# Patient Record
Sex: Female | Born: 1965 | Race: White | Hispanic: No | Smoking: Former smoker
Health system: Southern US, Community
[De-identification: ages and names within clinical notes are randomized; demographics above are authoritative.]

## PROBLEM LIST (undated history)

## (undated) DIAGNOSIS — R519 Headache, unspecified: Secondary | ICD-10-CM

## (undated) DIAGNOSIS — Z8619 Personal history of other infectious and parasitic diseases: Secondary | ICD-10-CM

## (undated) DIAGNOSIS — F329 Major depressive disorder, single episode, unspecified: Secondary | ICD-10-CM

## (undated) DIAGNOSIS — F32A Depression, unspecified: Secondary | ICD-10-CM

## (undated) DIAGNOSIS — E559 Vitamin D deficiency, unspecified: Secondary | ICD-10-CM

## (undated) DIAGNOSIS — R51 Headache: Secondary | ICD-10-CM

## (undated) HISTORY — DX: Major depressive disorder, single episode, unspecified: F32.9

## (undated) HISTORY — DX: Headache: R51

## (undated) HISTORY — DX: Vitamin D deficiency, unspecified: E55.9

## (undated) HISTORY — DX: Depression, unspecified: F32.A

## (undated) HISTORY — DX: Headache, unspecified: R51.9

## (undated) HISTORY — DX: Personal history of other infectious and parasitic diseases: Z86.19

---

## 2017-09-14 ENCOUNTER — Encounter: Payer: Self-pay | Admitting: Nurse Practitioner

## 2017-09-14 ENCOUNTER — Other Ambulatory Visit (INDEPENDENT_AMBULATORY_CARE_PROVIDER_SITE_OTHER): Payer: Commercial Managed Care - PPO

## 2017-09-14 ENCOUNTER — Ambulatory Visit (INDEPENDENT_AMBULATORY_CARE_PROVIDER_SITE_OTHER): Payer: Commercial Managed Care - PPO | Admitting: Nurse Practitioner

## 2017-09-14 VITALS — BP 112/70 | HR 70 | Temp 98.2°F | Resp 16 | Ht 66.0 in | Wt 187.0 lb

## 2017-09-14 DIAGNOSIS — Z23 Encounter for immunization: Secondary | ICD-10-CM

## 2017-09-14 DIAGNOSIS — L908 Other atrophic disorders of skin: Secondary | ICD-10-CM | POA: Diagnosis not present

## 2017-09-14 DIAGNOSIS — Z0001 Encounter for general adult medical examination with abnormal findings: Secondary | ICD-10-CM | POA: Diagnosis not present

## 2017-09-14 DIAGNOSIS — N6009 Solitary cyst of unspecified breast: Secondary | ICD-10-CM

## 2017-09-14 DIAGNOSIS — Z0000 Encounter for general adult medical examination without abnormal findings: Secondary | ICD-10-CM | POA: Insufficient documentation

## 2017-09-14 DIAGNOSIS — Z13220 Encounter for screening for lipoid disorders: Secondary | ICD-10-CM

## 2017-09-14 DIAGNOSIS — E669 Obesity, unspecified: Secondary | ICD-10-CM

## 2017-09-14 LAB — COMPREHENSIVE METABOLIC PANEL
ALT: 14 U/L (ref 0–35)
AST: 15 U/L (ref 0–37)
Albumin: 3.8 g/dL (ref 3.5–5.2)
Alkaline Phosphatase: 41 U/L (ref 39–117)
BUN: 15 mg/dL (ref 6–23)
CALCIUM: 8.8 mg/dL (ref 8.4–10.5)
CHLORIDE: 106 meq/L (ref 96–112)
CO2: 26 meq/L (ref 19–32)
CREATININE: 0.73 mg/dL (ref 0.40–1.20)
GFR: 89.87 mL/min (ref >60.00)
Glucose, Bld: 90 mg/dL (ref 70–99)
POTASSIUM: 3.9 meq/L (ref 3.5–5.1)
SODIUM: 137 meq/L (ref 135–145)
Total Bilirubin: 0.5 mg/dL (ref 0.2–1.2)
Total Protein: 6.8 g/dL (ref 6.0–8.3)

## 2017-09-14 LAB — CBC WITH DIFFERENTIAL/PLATELET
BASOS PCT: 0.6 % (ref 0.0–3.0)
Basophils Absolute: 0.1 10*3/uL (ref 0.0–0.1)
EOS ABS: 0.2 10*3/uL (ref 0.0–0.7)
Eosinophils Relative: 2.4 % (ref 0.0–5.0)
HCT: 39.4 % (ref 36.0–46.0)
Hemoglobin: 12.8 g/dL (ref 12.0–15.0)
LYMPHS ABS: 2.4 10*3/uL (ref 0.7–4.0)
Lymphocytes Relative: 26.9 % (ref 12.0–46.0)
MCHC: 32.4 g/dL (ref 30.0–36.0)
MCV: 84.9 fl (ref 78.0–100.0)
MONO ABS: 0.7 10*3/uL (ref 0.1–1.0)
Monocytes Relative: 7.9 % (ref 3.0–12.0)
NEUTROS PCT: 62.2 % (ref 43.0–77.0)
Neutro Abs: 5.6 10*3/uL (ref 1.4–7.7)
PLATELETS: 209 10*3/uL (ref 150.0–400.0)
RBC: 4.65 Mil/uL (ref 3.87–5.11)
RDW: 14.1 % (ref 11.5–15.5)
WBC: 8.9 10*3/uL (ref 4.0–10.5)

## 2017-09-14 LAB — LIPID PANEL
CHOL/HDL RATIO: 4
Cholesterol: 199 mg/dL (ref 0–200)
HDL: 52.7 mg/dL (ref >39.00)
LDL Cholesterol: 121 mg/dL — ABNORMAL HIGH (ref 0–99)
NONHDL: 146.24
Triglycerides: 126 mg/dL (ref 0.0–149.0)
VLDL: 25.2 mg/dL (ref 0.0–40.0)

## 2017-09-14 LAB — HEMOGLOBIN A1C: Hgb A1c MFr Bld: 5.4 % (ref 4.6–6.5)

## 2017-09-14 LAB — TSH: TSH: 3.3 u[IU]/mL (ref 0.35–4.50)

## 2017-09-14 NOTE — Assessment & Plan Note (Signed)
-  USPSTF grade A and B recommendations reviewed with patient; age-appropriate recommendations, preventive care, screening tests, etc discussed and encouraged; healthy living encouraged; see AVS for patient education given to patient -Discussed importance of 150 minutes of physical activity weekly, eat 6 servings of fruit/vegetables daily and drink plenty of water and avoid sweet beverages.  -Reviewed Health Maintenance: TDAP given today, otherwise up to date screening for cholesterol level - Lipid panel; Future Skin aging Annual skin check - Ambulatory referral to Dermatology

## 2017-09-14 NOTE — Progress Notes (Deleted)
Name: Kimberly Ayala   MRN: [id number]    DOB: May 14, 1966   Date:09/14/2017       Progress Note  Subjective  Chief Complaint  Chief Complaint  Patient presents with  . Establish Care    CPE, order for mammogram, fasting    HPI  Patient presents for annual CPE ***. Just moved from [place]  USPSTF grade A and B recommendations:  Diet: Breakfast: light-english muffin and cheese, Lunch-fish; Dinner-cooks at home- meat, fish, vegetables; Snacks-fruit or vegetables; Drinks-water, coffee in the am, one soda a day Exercise: not routinely exercising  Depression:  No flowsheet data found. Your depression screening is negative today-do you feel like you have had any issues? No concerns-  Hypertension: BP Readings from Last 3 Encounters:  09/14/17 112/70   Your BP is stable/not at goal- can you please return for discussion and management of this in the next 2 weeks so we can focus on that?  Obesity: Wt Readings from Last 3 Encounters:  09/14/17 187 lb (84.8 kg)   BMI Readings from Last 3 Encounters:  09/14/17 30.18 kg/m    Your BMI is X-its not always about that number but we do need to check for diabetes and cholesterol-code A1c and lipids  Lipids:  No results found for: CHOL No results found for: HDL No results found for: LDLCALC No results found for: TRIG No results found for: CHOLHDL No results found for: LDLDIRECT Glucose:  No results found for: GLUCOSE, GLUCAP q year as a screening or routine with HLD but they need f/u visit to discuss results  Alcohol: ***How many drinks a week? Social on weekend Tobacco use: ***how many PPD? Smoking cessation if needed. "Smoking cessation discussed." Former - quit 21 years ago  Divorced are you monogamous?  STD testing and prevention (chl/gon/syphilis): declines HIV, hep C: ***We will test today/declines-once in lifetime or annually if not monogamous? Once for hep c with baby boomers- it is recommended that we check. Declines,     Skin cancer: ***Concerning lesions? Dermatology? Referral to derm Colorectal cancer: ***Personal or family history? Bloody, dark,tarry stools? Changes in bowel movement? At age 40 order colonoscopy. If 40 or over, refer to GI. If under 40-hemocult if blood-refer to GI if +; miralax if constipated. NO Prostate cancer: *** No results found for: PSA IPSS Questionnaire (AUA-7): Over the past month.   1)  How often have you had a sensation of not emptying your bladder completely after you finish urinating?  {Rating:19227}  2)  How often have you had to urinate again less than two hours after you finished urinating? {Rating:19227}  3)  How often have you found you stopped and started again several times when you urinated?  {Rating:19227}  4) How difficult have you found it to postpone urination?  {Rating:19227}  5) How often have you had a weak urinary stream?  {Rating:19227}  6) How often have you had to push or strain to begin urination?  {Rating:19227}  7) How many times did you most typically get up to urinate from the time you went to bed until the time you got up in the morning?  {Rating:19228}  Total score:  0-7 mildly symptomatic-can offer PSA if they want or monitor-always if family hx   8-19 moderately symptomatic-always PSA   20-35 severely symptomatic-always PSA  Associate PSA with screening for prostate cancer  Lung cancer:  *** Low Dose CT Chest (ORDER CT chest lung ca screen low dose w/o CM-associate with lung cancer screening)  recommended if Age 64-80 years, 30 pack-year  currently smoking OR have quit w/in 15years. Patient {DOES NOT does:27190::"does not"} qualify.   AAA (ORDER US abd aorta screening AAA-associate with screening for AAA): *** The USPSTF recommends one-time screening with ultrasonography in men ages 7165 to 3175 years who have ever smoked-to check for weakening in your main artery so that we know and can monitor it- refer to specialist if abnormal (Have patient call  insurance to check coverage while awaiting referral)  Aspirin: ***taking? If HLD/CAD and no CI- recommend 81 daily-document "aortic atherosclerosis and recommended 81mg  asa today" no  ECG:  ***if indicated-if they report CP or SOB in ROS  {VACCINES HISTORY:21023000} testanus today   {hx healthmaintenance rooming patient:311061}  Advanced Care Planning: A voluntary discussion about advance care planning including the explanation and discussion of advance directives.  Discussed health care proxy and Living will, and the patient was able to identify a health care proxy as ***.  Patient {DOES_DOES XLK:44010}OT:18564} have a living will at present time. If patient does have living will, I have requested they bring this to the clinic to be scanned in to their chart. DOES NOT   There are no active problems to display for this patient.   *** The histories are not reviewed yet. Please review them in the "History" navigator section and refresh this SmartLink.  Family History  Problem Relation Age of Onset  . Diabetes Mother   . Hypertension Mother   . Hypertension Brother     Social History   Socioeconomic History  . Marital status: Divorced    Spouse name: Not on file  . Number of children: Not on file  . Years of education: Not on file  . Highest education level: Not on file  Social Needs  . Financial resource strain: Not on file  . Food insecurity - worry: Not on file  . Food insecurity - inability: Not on file  . Transportation needs - medical: Not on file  . Transportation needs - non-medical: Not on file  Occupational History  . Not on file  Tobacco Use  . Smoking status: Former Games developermoker  . Smokeless tobacco: Never Used  Substance and Sexual Activity  . Alcohol use: Yes  . Drug use: No  . Sexual activity: Not on file  Other Topics Concern  . Not on file  Social History Narrative  . Not on file    No current outpatient medications on file.  No Known  Allergies   ROS  *** Constitutional: Negative for fever or weight change.  Respiratory: Negative for cough and shortness of breath.   Cardiovascular: Negative for chest pain or palpitations.  Gastrointestinal: Negative for abdominal pain, no bowel changes.  Musculoskeletal: Negative for gait problem or joint swelling.  Skin: Negative for rash.  Neurological: Negative for dizziness or headache.  No other specific complaints in a complete review of systems (except as listed in HPI above).   Objective  Vitals:   09/14/17 1005  BP: 112/70  Pulse: 70  Resp: 16  Temp: 98.2 F (36.8 C)  TempSrc: Oral  SpO2: 98%  Weight: 187 lb (84.8 kg)  Height: 5\' 6"  (1.676 m)    Body mass index is 30.18 kg/m.  Physical Exam *** Constitutional: Patient appears well-developed and well-nourished. No distress.  HENT: Head: Normocephalic and atraumatic. Ears: B TMs ok, no erythema or effusion; Nose: Nose normal. Mouth/Throat: Oropharynx is clear and moist. No oropharyngeal exudate.  Eyes: Conjunctivae and EOM are  normal. Pupils are equal, round, and reactive to light. No scleral icterus.  Neck: Normal range of motion. Neck supple. No JVD present. No thyromegaly present.  Cardiovascular: Normal rate, regular rhythm and normal heart sounds.  No murmur heard. No BLE edema. Pulmonary/Chest: Effort normal and breath sounds normal. No respiratory distress. Abdominal: Soft. Bowel sounds are normal, no distension. There is no tenderness. no masses FEMALE GENITALIA: Normal descended testes bilaterally, no masses palpated, no hernias, no lesions, no discharge RECTAL: Prostate normal size and consistency, no rectal masses or hemorrhoids Musculoskeletal: Normal range of motion, no joint effusions. No gross deformities Neurological: he is alert and oriented to person, place, and time. No cranial nerve deficit. Coordination, balance, strength, speech and gait are normal.  Skin: Skin is warm and dry. No rash  noted. No erythema.  Psychiatric: Patient has a normal mood and affect. behavior is normal. Judgment and thought content normal.  No results found for this or any previous visit (from the past 2160 hour(s)).  Diabetic Foot Exam: Diabetic Foot Exam - Simple   No data filed     ***  PHQ2/9: No flowsheet data found. ***  Fall Risk: No flowsheet data found. ***   Assessment & Plan  There are no diagnoses linked to this encounter.   -Prostate cancer screening and PSA options (with potential risks and benefits of testing vs not testing) were discussed along with recent recs/guidelines. -USPSTF grade A and B recommendations reviewed with patient; age-appropriate recommendations, preventive care, screening tests, etc discussed and encouraged; healthy living encouraged; see AVS for patient education given to patient -Discussed importance of 150 minutes of physical activity weekly, eat two servings of fish weekly, eat one serving of tree nuts ( cashews, pistachios, pecans, almonds.Marland Kitchen) every other day, eat 6 servings of fruit/vegetables daily and drink plenty of water and avoid sweet beverages.  -Red flags and when to present for emergency care or RTC including fever >101.77F, chest pain, shortness of breath, new/worsening/un-resolving symptoms, *** reviewed with patient at time of visit. Follow up and care instructions discussed and provided in AVS. DELETE IF NOT NEEDED -Reviewed Health Maintenance: ***what did we do in health maintenance  ***Return in 1-2 weeks for chronic problems to discuss labs and management- cholesterol, diabetes, thyroid, vitamin d , HTN  Referral to derm tdap today Lipids, a1c, tsh cbc, cmet Mammogram - every 6 months
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
## 2017-09-14 NOTE — Patient Instructions (Signed)
Please head downstairs for lab work.  Please work on your diet and exercise as we discussed. Remember half of your plate should be veggies, one-fourth carbs, one-fourth meat, and don't eat meat at every meal. Also, remember to stay away from sugary drinks. I'd like for you to start incorporating exercise into your daily schedule. Start at 10 minutes a day, working up to 30 minutes five times a week.   Ill see you back in 1 year, or sooner if you need me.  Thanks for letting me take care of you today. Welcome to [facility]!   Preventive Care 40-64 Years, Female Preventive care refers to lifestyle choices and visits with your health care provider that can promote health and wellness. What does preventive care include?  A yearly physical exam. This is also called an annual well check.  Dental exams once or twice a year.  Routine eye exams. Ask your health care provider how often you should have your eyes checked.  Personal lifestyle choices, including: ? Daily care of your teeth and gums. ? Regular physical activity. ? Eating a healthy diet. ? Avoiding tobacco and drug use. ? Limiting alcohol use. ? Practicing safe sex. ? Taking low-dose aspirin daily starting at age 78. ? Taking vitamin and mineral supplements as recommended by your health care provider. What happens during an annual well check? The services and screenings done by your health care provider during your annual well check will depend on your age, overall health, lifestyle risk factors, and family history of disease. Counseling Your health care provider may ask you questions about your:  Alcohol use.  Tobacco use.  Drug use.  Emotional well-being.  Home and relationship well-being.  Sexual activity.  Eating habits.  Work and work Statistician.  Method of birth control.  Menstrual cycle.  Pregnancy history.  Screening You may have the following tests or measurements:  Height, weight, and BMI.  Blood  pressure.  Lipid and cholesterol levels. These may be checked every 5 years, or more frequently if you are over 62 years old.  Skin check.  Lung cancer screening. You may have this screening every year starting at age 51 if you have a 30-pack-year history of smoking and currently smoke or have quit within the past 15 years.  Fecal occult blood test (FOBT) of the stool. You may have this test every year starting at age 48.  Flexible sigmoidoscopy or colonoscopy. You may have a sigmoidoscopy every 5 years or a colonoscopy every 10 years starting at age 72.  Hepatitis C blood test.  Hepatitis B blood test.  Sexually transmitted disease (STD) testing.  Diabetes screening. This is done by checking your blood sugar (glucose) after you have not eaten for a while (fasting). You may have this done every 1-3 years.  Mammogram. This may be done every 1-2 years. Talk to your health care provider about when you should start having regular mammograms. This may depend on whether you have a family history of breast cancer.  BRCA-related cancer screening. This may be done if you have a family history of breast, ovarian, tubal, or peritoneal cancers.  Pelvic exam and Pap test. This may be done every 3 years starting at age 1. Starting at age 20, this may be done every 5 years if you have a Pap test in combination with an HPV test.  Bone density scan. This is done to screen for osteoporosis. You may have this scan if you are at high risk for osteoporosis.  Discuss your test results, treatment options, and if necessary, the need for more tests with your health care provider. Vaccines Your health care provider may recommend certain vaccines, such as:  Influenza vaccine. This is recommended every year.  Tetanus, diphtheria, and acellular pertussis (Tdap, Td) vaccine. You may need a Td booster every 10 years.  Varicella vaccine. You may need this if you have not been vaccinated.  Zoster vaccine. You  may need this after age 5.  Measles, mumps, and rubella (MMR) vaccine. You may need at least one dose of MMR if you were born in 1957 or later. You may also need a second dose.  Pneumococcal 13-valent conjugate (PCV13) vaccine. You may need this if you have certain conditions and were not previously vaccinated.  Pneumococcal polysaccharide (PPSV23) vaccine. You may need one or two doses if you smoke cigarettes or if you have certain conditions.  Meningococcal vaccine. You may need this if you have certain conditions.  Hepatitis A vaccine. You may need this if you have certain conditions or if you travel or work in places where you may be exposed to hepatitis A.  Hepatitis B vaccine. You may need this if you have certain conditions or if you travel or work in places where you may be exposed to hepatitis B.  Haemophilus influenzae type b (Hib) vaccine. You may need this if you have certain conditions.  Talk to your health care provider about which screenings and vaccines you need and how often you need them. This information is not intended to replace advice given to you by your health care provider. Make sure you discuss any questions you have with your health care provider. Document Released: 08/22/2015 Document Revised: 04/14/2016 Document Reviewed: 05/27/2015 Elsevier Interactive Patient Education  [employer].

## 2017-09-14 NOTE — Assessment & Plan Note (Signed)
We discussed healthy diet and exercise in the maintenance of overall health - See AVS for education provided to patient. - CBC with Differential/Platelet; Future - Comprehensive metabolic panel; Future - TSH; Future - Lipid panel; Future - Hemoglobin A1c; Future

## 2017-09-14 NOTE — Assessment & Plan Note (Addendum)
She is not sure which breast. She is going to call her provider in [place] and provide update so I can order diagnostic imaging-[place]. She did complete release form for old records in our office today

## 2017-09-14 NOTE — Addendum Note (Signed)
Addended by: Mercer PodWRENN, Apryll Hinkle E on: 09/14/2017 11:39 AM   Modules accepted: Orders

## 2017-09-15 ENCOUNTER — Other Ambulatory Visit: Payer: Self-pay | Admitting: Nurse Practitioner

## 2017-09-15 DIAGNOSIS — R928 Other abnormal and inconclusive findings on diagnostic imaging of breast: Secondary | ICD-10-CM

## 2017-10-26 ENCOUNTER — Ambulatory Visit
Admission: RE | Admit: 2017-10-26 | Discharge: 2017-10-26 | Disposition: A | Discharge status: Home / Self Care | Payer: Commercial Managed Care - PPO | Source: Ambulatory Visit | Attending: Nurse Practitioner | Admitting: Nurse Practitioner

## 2017-10-26 DIAGNOSIS — R928 Other abnormal and inconclusive findings on diagnostic imaging of breast: Secondary | ICD-10-CM

## 2020-07-11 ENCOUNTER — Encounter: Payer: Self-pay | Admitting: Nurse Practitioner

## 2020-07-11 ENCOUNTER — Other Ambulatory Visit: Payer: Self-pay

## 2020-07-11 ENCOUNTER — Ambulatory Visit (INDEPENDENT_AMBULATORY_CARE_PROVIDER_SITE_OTHER): Payer: Medicaid Other | Admitting: Nurse Practitioner

## 2020-07-11 VITALS — BP 127/79 | HR 70 | Temp 98.6°F | Ht 66.0 in | Wt 197.5 lb

## 2020-07-11 DIAGNOSIS — Z0000 Encounter for general adult medical examination without abnormal findings: Secondary | ICD-10-CM | POA: Diagnosis not present

## 2020-07-11 DIAGNOSIS — Z0001 Encounter for general adult medical examination with abnormal findings: Secondary | ICD-10-CM

## 2020-07-11 DIAGNOSIS — E669 Obesity, unspecified: Secondary | ICD-10-CM

## 2020-07-11 NOTE — Patient Instructions (Signed)
Health Maintenance, Female Adopting a healthy lifestyle and getting preventive care are important in promoting health and wellness. Ask your health care provider about:  The right schedule for you to have regular tests and exams.  Things you can do on your own to prevent diseases and keep yourself healthy. What should I know about diet, weight, and exercise? Eat a healthy diet   Eat a diet that includes plenty of vegetables, fruits, low-fat dairy products, and lean protein.  Do not eat a lot of foods that are high in solid fats, added sugars, or sodium. Maintain a healthy weight Body mass index (BMI) is used to identify weight problems. It estimates body fat based on height and weight. Your health care provider can help determine your BMI and help you achieve or maintain a healthy weight. Get regular exercise Get regular exercise. This is one of the most important things you can do for your health. Most adults should:  Exercise for at least 150 minutes each week. The exercise should increase your heart rate and make you sweat (moderate-intensity exercise).  Do strengthening exercises at least twice a week. This is in addition to the moderate-intensity exercise.  Spend less time sitting. Even light physical activity can be beneficial. Watch cholesterol and blood lipids Have your blood tested for lipids and cholesterol at 52 years of age, then have this test every 5 years. Have your cholesterol levels checked more often if:  Your lipid or cholesterol levels are high.  You are older than 52 years of age.  You are at high risk for heart disease. What should I know about cancer screening? Depending on your health history and family history, you may need to have cancer screening at various ages. This may include screening for:  Breast cancer.  Cervical cancer.  Colorectal cancer.  Skin cancer.  Lung cancer. What should I know about heart disease, diabetes, and high blood  pressure? Blood pressure and heart disease  High blood pressure causes heart disease and increases the risk of stroke. This is more likely to develop in people who have high blood pressure readings, are of African descent, or are overweight.  Have your blood pressure checked: ? Every 3-5 years if you are 18-39 years of age. ? Every year if you are 40 years old or older. Diabetes Have regular diabetes screenings. This checks your fasting blood sugar level. Have the screening done:  Once every three years after age 40 if you are at a normal weight and have a low risk for diabetes.  More often and at a younger age if you are overweight or have a high risk for diabetes. What should I know about preventing infection? Hepatitis B If you have a higher risk for hepatitis B, you should be screened for this virus. Talk with your health care provider to find out if you are at risk for hepatitis B infection. Hepatitis C Testing is recommended for:  Everyone born from 1945 through 1965.  Anyone with known risk factors for hepatitis C. Sexually transmitted infections (STIs)  Get screened for STIs, including gonorrhea and chlamydia, if: ? You are sexually active and are younger than 52 years of age. ? You are older than 52 years of age and your health care provider tells you that you are at risk for this type of infection. ? Your sexual activity has changed since you were last screened, and you are at increased risk for chlamydia or gonorrhea. Ask your health care provider if   you are at risk.  Ask your health care provider about whether you are at high risk for HIV. Your health care provider may recommend a prescription medicine to help prevent HIV infection. If you choose to take medicine to prevent HIV, you should first get tested for HIV. You should then be tested every 3 months for as long as you are taking the medicine. Pregnancy  If you are about to stop having your period (premenopausal) and  you may become pregnant, seek counseling before you get pregnant.  Take 400 to 800 micrograms (mcg) of folic acid every day if you become pregnant.  Ask for birth control (contraception) if you want to prevent pregnancy. Osteoporosis and menopause Osteoporosis is a disease in which the bones lose minerals and strength with aging. This can result in bone fractures. If you are 65 years old or older, or if you are at risk for osteoporosis and fractures, ask your health care provider if you should:  Be screened for bone loss.  Take a calcium or vitamin D supplement to lower your risk of fractures.  Be given hormone replacement therapy (HRT) to treat symptoms of menopause. Follow these instructions at home: Lifestyle  Do not use any products that contain nicotine or tobacco, such as cigarettes, e-cigarettes, and chewing tobacco. If you need help quitting, ask your health care provider.  Do not use street drugs.  Do not share needles.  Ask your health care provider for help if you need support or information about quitting drugs. Alcohol use  Do not drink alcohol if: ? Your health care provider tells you not to drink. ? You are pregnant, may be pregnant, or are planning to become pregnant.  If you drink alcohol: ? Limit how much you use to 0-1 drink a day. ? Limit intake if you are breastfeeding.  Be aware of how much alcohol is in your drink. In the [place] one drink equals one 12 oz bottle of beer (355 mL), one 5 oz glass of wine (148 mL), or one 1 oz glass of hard liquor (44 mL). General instructions  Schedule regular health, dental, and eye exams.  Stay current with your vaccines.  Tell your health care provider if: ? You often feel depressed. ? You have ever been abused or do not feel safe at home. Summary  Adopting a healthy lifestyle and getting preventive care are important in promoting health and wellness.  Follow your health care provider's instructions about healthy  diet, exercising, and getting tested or screened for diseases.  Follow your health care provider's instructions on monitoring your cholesterol and blood pressure. This information is not intended to replace advice given to you by your health care provider. Make sure you discuss any questions you have with your health care provider. Document Revised: 07/19/2018 Document Reviewed: 07/19/2018 Elsevier Patient Education  2020 Elsevier Inc[employer]  

## 2020-07-11 NOTE — Progress Notes (Signed)
New Patient Note  RE: Kimberly Ayala MRN: [id number] DOB: [birth year] Date of Office Visit: 07/11/2020  Chief Complaint: New Patient (Initial Visit)  Annual physical Exam  History of Present Illness: .   Encounter for general adult medical examination without abnormal findings  Physical: Patient's last physical exam was a few ears ago .  Weight: Appropriate for height (BMI less than 27%) ; No Blood Pressure: Normal (BP less than 120/80) ;  Medical History: Patient history reviewed ; Family history reviewed ;  Allergies Reviewed: No change in current allergies ;  Medications Reviewed: Medications reviewed - no changes ;  Lipids: Normal lipid levels ;  Smoking: Life-long non-smoker ;  Physical Activity: Exercises at least 3 times per week ; No Alcohol/Drug Use: Is a social -drinker ; No illicit drug use ;  Patient is not afflicted from Stress Incontinence and Urge Incontinence  Safety: reviewed ; Patient wears a seat belt, has smoke detectors, has carbon monoxide detectors, practices appropriate gun safety, and does not wear sunscreen with extended sun exposure. Dental Care: biannual cleanings, brushes and flosses daily. Ophthalmology/Optometry: Annual visit.  Hearing loss: none Vision impairments: none   Assessment and Plan: Kimberly Ayala is a 54 y.o. female with: Annual physical exam Patient is a 54 year old female who presents to clinic for an annual physical exam without abnormal findings.  Completed head to toe assessment.  Patient is a healthy woman with no prior medical history.  Provided education on preventative and health care maintenance.  Provided printed handouts. Labs completed-CBC, CMP, lipid panel, Cologuard order placed.  Mammogram ordered.  Follow-up in 1 year.  Obesity (BMI 30-39.9) Continue to educate and provide information on weight maintenance.  Continue healthy diet and exercise regimen.  Return in about 1 year (around 07/11/2021).   Diagnostics:   Past Medical  History: Patient Active Problem List   Diagnosis Date Noted  . Encounter for general adult medical examination with abnormal findings 09/14/2017  . Obesity (BMI 30-39.9) 09/14/2017  . Cyst of breast 09/14/2017   Past Medical History:  Diagnosis Date  . Depression   . Frequent headaches   . History of chickenpox   . Vitamin D deficiency    Past Surgical History: History reviewed. No pertinent surgical history. Medication List:  No current outpatient medications on file.   No current facility-administered medications for this visit.   Allergies: No Known Allergies Social History: Social History   Socioeconomic History  . Marital status: Divorced    Spouse name: Not on file  . Number of children: 2  . Years of education: 68  . Highest education level: Master's degree (e.g., MA, MS, MEng, MEd, MSW, MBA)  Occupational History  . Not on file  Tobacco Use  . Smoking status: Former Smoker    Packs/day: 0.50    Years: 10.00    Pack years: 5.00    Types: Cigarettes    Quit date: 2004    Years since quitting: 17.9  . Smokeless tobacco: Never Used  Vaping Use  . Vaping Use: Never used  Substance and Sexual Activity  . Alcohol use: Yes    Alcohol/week: 2.0 standard drinks    Types: 2 Cans of beer per week  . Drug use: No  . Sexual activity: Yes    Birth control/protection: None  Other Topics Concern  . Not on file  Social History Narrative  . Not on file   Social Determinants of Health   Financial Resource Strain:   . Difficulty of  Paying Living Expenses: Not on file  Food Insecurity:   . Worried About Programme researcher, broadcasting/film/video in the Last Year: Not on file  . Ran Out of Food in the Last Year: Not on file  Transportation Needs:   . Lack of Transportation (Medical): Not on file  . Lack of Transportation (Non-Medical): Not on file  Physical Activity:   . Days of Exercise per Week: Not on file  . Minutes of Exercise per Session: Not on file  Stress:   . Feeling of  Stress : Not on file  Social Connections:   . Frequency of Communication with Friends and Family: Not on file  . Frequency of Social Gatherings with Friends and Family: Not on file  . Attends Religious Services: Not on file  . Active Member of Clubs or Organizations: Not on file  . Attends Banker Meetings: Not on file  . Marital Status: Not on file       Family History: Family History  Problem Relation Age of Onset  . Diabetes Mother   . Hypertension Mother   . Stroke Mother   . Hypertension Brother          Review of Systems  Constitutional: Negative.   HENT: Negative.   Eyes: Negative.   Respiratory: Negative.   Genitourinary: Negative.   Skin: Negative.   All other systems reviewed and are negative.  Objective: BP 127/79   Pulse 70   Temp 98.6 F (37 C) (Temporal)   Ht 5\' 6"  (1.676 m)   Wt 197 lb 8 oz (89.6 kg)   BMI 31.88 kg/m  Body mass index is 31.88 kg/m. Physical Exam Vitals reviewed.  Constitutional:      General: She is awake.     Appearance: Normal appearance. She is well-developed, well-groomed and overweight.  HENT:     Head: Normocephalic.     Right Ear: External ear normal. There is no impacted cerumen.     Left Ear: External ear normal. There is no impacted cerumen.     Nose: Nose normal.     Mouth/Throat:     Mouth: Mucous membranes are moist.     Pharynx: Oropharynx is clear. No oropharyngeal exudate.  Eyes:     Conjunctiva/sclera: Conjunctivae normal.     Pupils: Pupils are equal, round, and reactive to light.  Cardiovascular:     Rate and Rhythm: Normal rate and regular rhythm.     Pulses: Normal pulses.     Heart sounds: Normal heart sounds.  Pulmonary:     Effort: Pulmonary effort is normal.     Breath sounds: Normal breath sounds.  Abdominal:     General: Bowel sounds are normal. There is no distension.     Tenderness: There is no abdominal tenderness.  Musculoskeletal:        General: No tenderness. Normal  range of motion.     Cervical back: Normal range of motion.  Skin:    General: Skin is warm.  Neurological:     Mental Status: She is alert and oriented to person, place, and time.  Psychiatric:        Mood and Affect: Mood normal.        Behavior: Behavior normal. Behavior is cooperative.    The plan was reviewed with the patient/family, and all questions/concerned were addressed.  It was my pleasure to see Ailey today and participate in her care. Please feel free to contact me with any questions or concerns.  Sincerely,  Lynnell Chad NP [facility]

## 2020-07-12 LAB — COMPREHENSIVE METABOLIC PANEL
ALT: 18 IU/L (ref 0–32)
AST: 20 IU/L (ref 0–40)
Albumin/Globulin Ratio: 1.5 (ref 1.2–2.2)
Albumin: 4 g/dL (ref 3.8–4.9)
Alkaline Phosphatase: 60 IU/L (ref 44–121)
BUN/Creatinine Ratio: 18 (ref 9–23)
BUN: 13 mg/dL (ref 6–24)
Bilirubin Total: 0.2 mg/dL (ref 0.0–1.2)
CO2: 26 mmol/L (ref 20–29)
Calcium: 9.4 mg/dL (ref 8.7–10.2)
Chloride: 104 mmol/L (ref 96–106)
Creatinine, Ser: 0.71 mg/dL (ref 0.57–1.00)
GFR calc Af Amer: 113 mL/min/{1.73_m2} (ref >59)
GFR calc non Af Amer: 98 mL/min/{1.73_m2} (ref >59)
Globulin, Total: 2.7 g/dL (ref 1.5–4.5)
Glucose: 87 mg/dL (ref 65–99)
Potassium: 4.3 mmol/L (ref 3.5–5.2)
Sodium: 141 mmol/L (ref 134–144)
Total Protein: 6.7 g/dL (ref 6.0–8.5)

## 2020-07-12 LAB — CBC WITH DIFFERENTIAL/PLATELET
Basophils Absolute: 0.1 10*3/uL (ref 0.0–0.2)
Basos: 1 %
EOS (ABSOLUTE): 0.3 10*3/uL (ref 0.0–0.4)
Eos: 4 %
Hematocrit: 38.2 % (ref 34.0–46.6)
Hemoglobin: 12.4 g/dL (ref 11.1–15.9)
Immature Grans (Abs): 0 10*3/uL (ref 0.0–0.1)
Immature Granulocytes: 0 %
Lymphocytes Absolute: 2.5 10*3/uL (ref 0.7–3.1)
Lymphs: 32 %
MCH: 27.3 pg (ref 26.6–33.0)
MCHC: 32.5 g/dL (ref 31.5–35.7)
MCV: 84 fL (ref 79–97)
Monocytes Absolute: 0.7 10*3/uL (ref 0.1–0.9)
Monocytes: 9 %
Neutrophils Absolute: 4.2 10*3/uL (ref 1.4–7.0)
Neutrophils: 54 %
Platelets: 262 10*3/uL (ref 150–450)
RBC: 4.54 x10E6/uL (ref 3.77–5.28)
RDW: 12.5 % (ref 11.7–15.4)
WBC: 7.8 10*3/uL (ref 3.4–10.8)

## 2020-07-12 LAB — LIPID PANEL
Chol/HDL Ratio: 3.9 ratio (ref 0.0–4.4)
Cholesterol, Total: 236 mg/dL — ABNORMAL HIGH (ref 100–199)
HDL: 60 mg/dL (ref >39)
LDL Chol Calc (NIH): 140 mg/dL — ABNORMAL HIGH (ref 0–99)
Triglycerides: 203 mg/dL — ABNORMAL HIGH (ref 0–149)
VLDL Cholesterol Cal: 36 mg/dL (ref 5–40)

## 2020-07-12 NOTE — Assessment & Plan Note (Signed)
Continue to educate and provide information on weight maintenance.  Continue healthy diet and exercise regimen.

## 2020-07-12 NOTE — Assessment & Plan Note (Signed)
Patient is a 54 year old female who presents to clinic for an annual physical exam without abnormal findings.  Completed head to toe assessment.  Patient is a healthy woman with no prior medical history.  Provided education on preventative and health care maintenance.  Provided printed handouts. Labs completed-CBC, CMP, lipid panel, Cologuard order placed.  Mammogram ordered.  Follow-up in 1 year.

## 2020-08-06 ENCOUNTER — Other Ambulatory Visit: Payer: Self-pay | Admitting: Nurse Practitioner

## 2020-08-06 ENCOUNTER — Telehealth: Payer: Self-pay

## 2020-08-06 NOTE — Telephone Encounter (Signed)
Pt wanted to know if she needs to do anything about her cholesterol since her lipids were elevated-didn't see any notes on the results.

## 2020-08-06 NOTE — Telephone Encounter (Signed)
For elevated cholesterol, patient recommendation-low-cholesterol diet, exercise, follow-up in 3 months to repeat lipids

## 2020-08-06 NOTE — Telephone Encounter (Signed)
Attempted to contact patient - NA °

## 2020-08-15 NOTE — Telephone Encounter (Signed)
Attempted to contact pt without a return call in over 3 days, will close encounter. 

## 2020-08-27 ENCOUNTER — Other Ambulatory Visit: Payer: Self-pay | Admitting: Nurse Practitioner

## 2020-08-27 DIAGNOSIS — Z1231 Encounter for screening mammogram for malignant neoplasm of breast: Secondary | ICD-10-CM

## 2020-09-03 ENCOUNTER — Other Ambulatory Visit: Payer: Self-pay

## 2020-09-03 ENCOUNTER — Ambulatory Visit
Admission: RE | Admit: 2020-09-03 | Discharge: 2020-09-03 | Disposition: A | Discharge status: Home / Self Care | Payer: Medicaid Other | Source: Ambulatory Visit | Attending: Nurse Practitioner | Admitting: Nurse Practitioner

## 2020-09-03 DIAGNOSIS — Z1231 Encounter for screening mammogram for malignant neoplasm of breast: Secondary | ICD-10-CM

## 2020-09-05 ENCOUNTER — Other Ambulatory Visit: Payer: Self-pay | Admitting: Nurse Practitioner

## 2020-09-05 DIAGNOSIS — R928 Other abnormal and inconclusive findings on diagnostic imaging of breast: Secondary | ICD-10-CM

## 2020-09-19 ENCOUNTER — Other Ambulatory Visit: Payer: Self-pay

## 2020-09-19 ENCOUNTER — Ambulatory Visit
Admission: RE | Admit: 2020-09-19 | Discharge: 2020-09-19 | Disposition: A | Discharge status: Home / Self Care | Payer: Medicaid Other | Source: Ambulatory Visit | Attending: Nurse Practitioner | Admitting: Nurse Practitioner

## 2020-09-19 ENCOUNTER — Other Ambulatory Visit: Payer: Self-pay | Admitting: Nurse Practitioner

## 2020-09-19 DIAGNOSIS — R928 Other abnormal and inconclusive findings on diagnostic imaging of breast: Secondary | ICD-10-CM

## 2020-09-19 DIAGNOSIS — R921 Mammographic calcification found on diagnostic imaging of breast: Secondary | ICD-10-CM | POA: Diagnosis not present

## 2020-09-19 DIAGNOSIS — N6489 Other specified disorders of breast: Secondary | ICD-10-CM | POA: Diagnosis not present

## 2020-09-19 DIAGNOSIS — R922 Inconclusive mammogram: Secondary | ICD-10-CM | POA: Diagnosis not present

## 2020-09-24 ENCOUNTER — Other Ambulatory Visit: Payer: Self-pay

## 2020-09-24 ENCOUNTER — Ambulatory Visit
Admission: RE | Admit: 2020-09-24 | Discharge: 2020-09-24 | Disposition: A | Discharge status: Home / Self Care | Payer: Medicaid Other | Source: Ambulatory Visit | Attending: Nurse Practitioner | Admitting: Nurse Practitioner

## 2020-09-24 ENCOUNTER — Other Ambulatory Visit: Payer: Self-pay | Admitting: Diagnostic Radiology

## 2020-09-24 DIAGNOSIS — R921 Mammographic calcification found on diagnostic imaging of breast: Secondary | ICD-10-CM | POA: Diagnosis not present

## 2020-09-24 DIAGNOSIS — R928 Other abnormal and inconclusive findings on diagnostic imaging of breast: Secondary | ICD-10-CM

## 2020-09-24 HISTORY — PX: BREAST BIOPSY: SHX20

## 2020-10-01 DIAGNOSIS — Z1212 Encounter for screening for malignant neoplasm of rectum: Secondary | ICD-10-CM | POA: Diagnosis not present

## 2020-10-14 ENCOUNTER — Encounter: Payer: Self-pay | Admitting: Nurse Practitioner

## 2020-10-17 LAB — COLOGUARD: Cologuard: NEGATIVE

## 2020-10-26 ENCOUNTER — Encounter: Payer: Self-pay | Admitting: Nurse Practitioner

## 2021-01-08 ENCOUNTER — Other Ambulatory Visit (HOSPITAL_BASED_OUTPATIENT_CLINIC_OR_DEPARTMENT_OTHER): Payer: Self-pay

## 2021-01-08 ENCOUNTER — Ambulatory Visit: Payer: Medicaid Other | Attending: Internal Medicine

## 2021-01-08 ENCOUNTER — Other Ambulatory Visit: Payer: Self-pay

## 2021-01-08 DIAGNOSIS — Z23 Encounter for immunization: Secondary | ICD-10-CM

## 2021-01-08 MED ORDER — PFIZER-BIONT COVID-19 VAC-TRIS 30 MCG/0.3ML IM SUSP
INTRAMUSCULAR | 0 refills | Status: DC
  Filled 2021-01-08: qty 0.3, 1d supply, fill #0

## 2021-01-08 NOTE — Progress Notes (Signed)
   Covid-19 Vaccination Clinic  Name:  Kimberly Ayala    MRN: [id number] DOB: 1965-12-07  01/08/2021  [name] was observed post Covid-19 immunization for 15 minutes without incident. She was provided with Vaccine Information Sheet and instruction to access the V-Safe system.   [name] was instructed to call 911 with any severe reactions post vaccine: Marland Kitchen Difficulty breathing  . Swelling of face and throat  . A fast heartbeat  . A bad rash all over body  . Dizziness and weakness   Immunizations Administered    Name Date Dose VIS Date Route   PFIZER Comrnaty(Gray TOP) Covid-19 Vaccine 01/08/2021 10:54 AM 0.3 mL 07/17/2020 Intramuscular   Manufacturer: [employer], [employer]   Lot: [id number]   NDC: [phone]

## 2021-01-30 ENCOUNTER — Ambulatory Visit: Payer: Medicaid Other

## 2021-02-05 ENCOUNTER — Other Ambulatory Visit (HOSPITAL_BASED_OUTPATIENT_CLINIC_OR_DEPARTMENT_OTHER): Payer: Self-pay

## 2021-02-05 ENCOUNTER — Ambulatory Visit: Payer: Medicaid Other | Attending: Internal Medicine

## 2021-02-05 ENCOUNTER — Other Ambulatory Visit: Payer: Self-pay

## 2021-02-05 DIAGNOSIS — Z23 Encounter for immunization: Secondary | ICD-10-CM

## 2021-02-05 MED ORDER — PFIZER-BIONT COVID-19 VAC-TRIS 30 MCG/0.3ML IM SUSP
INTRAMUSCULAR | 0 refills | Status: DC
  Filled 2021-02-05: qty 0.3, 1d supply, fill #0

## 2021-02-05 NOTE — Progress Notes (Signed)
   Covid-19 Vaccination Clinic  Name:  NETHRA MEHLBERG    MRN: [id number] DOB: 02/17/1966  02/05/2021  Ms. Arzola was observed post Covid-19 immunization for 15 minutes without incident. She was provided with Vaccine Information Sheet and instruction to access the V-Safe system.   Ms. Hammer was instructed to call 911 with any severe reactions post vaccine: Difficulty breathing  Swelling of face and throat  A fast heartbeat  A bad rash all over body  Dizziness and weakness   Immunizations Administered     Name Date Dose VIS Date Route   PFIZER Comrnaty(Gray TOP) Covid-19 Vaccine 02/05/2021 12:56 PM 0.3 mL 07/17/2020 Intramuscular   Manufacturer: [employer], [employer]   Lot: [id number]   NDC: [phone]

## 2021-04-07 ENCOUNTER — Telehealth: Payer: Self-pay | Admitting: *Deleted

## 2021-04-07 ENCOUNTER — Other Ambulatory Visit: Payer: Self-pay | Admitting: Nurse Practitioner

## 2021-04-07 DIAGNOSIS — R921 Mammographic calcification found on diagnostic imaging of breast: Secondary | ICD-10-CM

## 2021-04-07 NOTE — Telephone Encounter (Signed)
Left patient an urgent message to call the office if she wants to schedule with another office that is closer to [place]. If patient does keep scheduled appointment, appointment information was given.

## 2021-04-20 ENCOUNTER — Other Ambulatory Visit: Payer: Medicaid Other

## 2021-04-29 ENCOUNTER — Other Ambulatory Visit: Payer: Self-pay | Admitting: Nurse Practitioner

## 2021-04-29 ENCOUNTER — Ambulatory Visit
Admission: RE | Admit: 2021-04-29 | Discharge: 2021-04-29 | Disposition: A | Discharge status: Home / Self Care | Payer: Medicaid Other | Source: Ambulatory Visit | Attending: Nurse Practitioner | Admitting: Nurse Practitioner

## 2021-04-29 ENCOUNTER — Other Ambulatory Visit: Payer: Self-pay

## 2021-04-29 DIAGNOSIS — R921 Mammographic calcification found on diagnostic imaging of breast: Secondary | ICD-10-CM

## 2021-04-29 DIAGNOSIS — R922 Inconclusive mammogram: Secondary | ICD-10-CM | POA: Diagnosis not present

## 2021-05-14 ENCOUNTER — Encounter: Payer: Medicaid Other | Admitting: Obstetrics and Gynecology

## 2021-05-18 ENCOUNTER — Other Ambulatory Visit (HOSPITAL_BASED_OUTPATIENT_CLINIC_OR_DEPARTMENT_OTHER): Payer: Self-pay

## 2021-06-10 ENCOUNTER — Other Ambulatory Visit: Payer: Medicaid Other | Admitting: Adult Health

## 2021-07-22 ENCOUNTER — Other Ambulatory Visit: Payer: Self-pay

## 2021-07-22 ENCOUNTER — Ambulatory Visit (INDEPENDENT_AMBULATORY_CARE_PROVIDER_SITE_OTHER): Payer: Medicaid Other | Admitting: Obstetrics & Gynecology

## 2021-07-22 ENCOUNTER — Encounter: Payer: Self-pay | Admitting: Obstetrics & Gynecology

## 2021-07-22 ENCOUNTER — Other Ambulatory Visit (HOSPITAL_COMMUNITY)
Admission: RE | Admit: 2021-07-22 | Discharge: 2021-07-22 | Disposition: A | Discharge status: Home / Self Care | Payer: Medicaid Other | Source: Ambulatory Visit | Attending: Obstetrics & Gynecology | Admitting: Obstetrics & Gynecology

## 2021-07-22 VITALS — BP 127/83 | HR 70 | Ht 66.0 in | Wt 201.5 lb

## 2021-07-22 DIAGNOSIS — Z01419 Encounter for gynecological examination (general) (routine) without abnormal findings: Secondary | ICD-10-CM | POA: Insufficient documentation

## 2021-07-22 NOTE — Progress Notes (Signed)
° °  WELL-WOMAN EXAMINATION Patient name: Kimberly Ayala MRN [id number]  Date of birth: 05-Apr-1966 Chief Complaint:   Gynecologic Exam  History of Present Illness:   Kimberly Ayala is a 55 y.o. G30P2002  female being seen today for a routine well-woman exam.   Menses typically every 3 mos- typically last about 7+ days.  Intermenstrual bleeding.  Notes heavy bleeding- usually "size 5" pads due to considerable bleeding and clots.  Only one day of dysmenorrhea.  While her periods are heavy, she is not interested in management at this time.  Patient's last menstrual period was 07/08/2021 (approximate). Denies issues with her menses The current method of family planning is condoms.   Of note, she is concerned about some small white bumps that she has started to note on her vulva.  On occasion she will "pop" them.  Denies abnormal discharge, itching or irritatin.  Denies pain to the area.  Sexually active with one partner  Last pap due today.  Last mammogram: 04/2021. Cologuard completed 09/2020  Depression screen PHQ 2/9 07/22/2021  Decreased Interest 0  Down, Depressed, Hopeless 0  PHQ - 2 Score 0  Altered sleeping 0  Tired, decreased energy 0  Change in appetite 0  Feeling bad or failure about yourself  0  Trouble concentrating 0  Moving slowly or fidgety/restless 0  Suicidal thoughts 0  PHQ-9 Score 0      Review of Systems:   Pertinent items are noted in HPI Denies any headaches, blurred vision, fatigue, shortness of breath, chest pain, abdominal pain, bowel movements, urination, or intercourse unless otherwise stated above.  Pertinent History Reviewed:  Reviewed past medical,surgical, social and family history.  Reviewed problem list, medications and allergies. Physical Assessment:   Vitals:   07/22/21 1032  BP: 127/83  Pulse: 70  Weight: 201 lb 8 oz (91.4 kg)  Height: 5\' 6"  (1.676 m)  Body mass index is 32.52 kg/m.        Physical Examination:   General appearance -  well appearing, and in no distress  Mental status - alert, oriented to person, place, and time  Psych:  She has a normal mood and affect  Skin - warm and dry, normal color, no suspicious lesions noted  Chest - effort normal, all lung fields clear to auscultation bilaterally  Heart - normal rate and regular rhythm  Neck:  midline trachea, no thyromegaly or nodules  Breasts - breasts appear normal, no suspicious masses, no skin or nipple changes or  axillary nodes  Abdomen - soft, nontender, nondistended, no masses or organomegaly  Pelvic - VULVA: normal appearing vulva with no masses, tenderness or lesions  VAGINA: normal appearing vagina with normal color and discharge, no lesions  CERVIX: normal appearing cervix without discharge or lesions, no CMT  Thin prep pap is done with HR HPV cotesting  UTERUS: uterus is felt to be normal size, shape, consistency and nontender   ADNEXA: No adnexal masses or tenderness noted.  Extremities:  No swelling or varicosities noted  Chaperone:     Assessment & Plan:  1) Well-Woman Exam -pap collected reviewed guidelines -mammogram up to date -cologuard completed   Meds: No orders of the defined types were placed in this encounter.   Follow-up: Return in about 1 year (around 07/22/2022) for Annual.   07/24/2022, DO Attending Obstetrician & Gynecologist, [facility], [facility]

## 2021-07-24 LAB — CYTOLOGY - PAP
Comment: NEGATIVE
Diagnosis: NEGATIVE
High risk HPV: NEGATIVE

## 2021-08-01 DIAGNOSIS — H5213 Myopia, bilateral: Secondary | ICD-10-CM | POA: Diagnosis not present

## 2021-08-12 ENCOUNTER — Ambulatory Visit: Payer: Medicaid Other | Admitting: Nurse Practitioner

## 2021-08-12 ENCOUNTER — Encounter: Payer: Self-pay | Admitting: Nurse Practitioner

## 2021-08-12 VITALS — BP 121/64 | HR 63 | Temp 98.0°F | Ht 66.0 in | Wt 201.0 lb

## 2021-08-12 DIAGNOSIS — Z0000 Encounter for general adult medical examination without abnormal findings: Secondary | ICD-10-CM | POA: Diagnosis not present

## 2021-08-12 LAB — COMPREHENSIVE METABOLIC PANEL
ALT: 18 IU/L (ref 0–32)
AST: 19 IU/L (ref 0–40)
Albumin/Globulin Ratio: 1.4 (ref 1.2–2.2)
Albumin: 3.8 g/dL (ref 3.8–4.9)
Alkaline Phosphatase: 57 IU/L (ref 44–121)
BUN/Creatinine Ratio: 17 (ref 9–23)
BUN: 12 mg/dL (ref 6–24)
Bilirubin Total: 0.2 mg/dL (ref 0.0–1.2)
CO2: 22 mmol/L (ref 20–29)
Calcium: 9.3 mg/dL (ref 8.7–10.2)
Chloride: 106 mmol/L (ref 96–106)
Creatinine, Ser: 0.7 mg/dL (ref 0.57–1.00)
Globulin, Total: 2.8 g/dL (ref 1.5–4.5)
Glucose: 91 mg/dL (ref 70–99)
Potassium: 4.4 mmol/L (ref 3.5–5.2)
Sodium: 143 mmol/L (ref 134–144)
Total Protein: 6.6 g/dL (ref 6.0–8.5)
eGFR: 103 mL/min/{1.73_m2} (ref >59)

## 2021-08-12 LAB — CBC WITH DIFFERENTIAL/PLATELET
Basophils Absolute: 0.1 10*3/uL (ref 0.0–0.2)
Basos: 1 %
EOS (ABSOLUTE): 0.4 10*3/uL (ref 0.0–0.4)
Eos: 5 %
Hematocrit: 38.2 % (ref 34.0–46.6)
Hemoglobin: 12.5 g/dL (ref 11.1–15.9)
Immature Grans (Abs): 0 10*3/uL (ref 0.0–0.1)
Immature Granulocytes: 0 %
Lymphocytes Absolute: 2.2 10*3/uL (ref 0.7–3.1)
Lymphs: 28 %
MCH: 27.7 pg (ref 26.6–33.0)
MCHC: 32.7 g/dL (ref 31.5–35.7)
MCV: 85 fL (ref 79–97)
Monocytes Absolute: 0.6 10*3/uL (ref 0.1–0.9)
Monocytes: 8 %
Neutrophils Absolute: 4.8 10*3/uL (ref 1.4–7.0)
Neutrophils: 58 %
Platelets: 247 10*3/uL (ref 150–450)
RBC: 4.52 x10E6/uL (ref 3.77–5.28)
RDW: 12.7 % (ref 11.7–15.4)
WBC: 8.1 10*3/uL (ref 3.4–10.8)

## 2021-08-12 LAB — LIPID PANEL
Chol/HDL Ratio: 3.8 ratio (ref 0.0–4.4)
Cholesterol, Total: 225 mg/dL — ABNORMAL HIGH (ref 100–199)
HDL: 59 mg/dL (ref >39)
LDL Chol Calc (NIH): 137 mg/dL — ABNORMAL HIGH (ref 0–99)
Triglycerides: 161 mg/dL — ABNORMAL HIGH (ref 0–149)
VLDL Cholesterol Cal: 29 mg/dL (ref 5–40)

## 2021-08-12 NOTE — Progress Notes (Signed)
Established Patient Office Visit  Subjective:  Patient ID: Kimberly Ayala, female    DOB: 30-Sep-1965  Age: 56 y.o. MRN: [id number]  CC:  Chief Complaint  Patient presents with   Annual Exam    HPI Kimberly Ayala presents for .   Encounter for general adult medical examination Physical: Patient's last physical exam was 1 year ago .  Weight: Appropriate for height (BMI greater than 27%) ;  Blood Pressure: Normal (BP less than 120/80) ;  Medical History: Patient history reviewed ; Family history reviewed ;  Allergies Reviewed: No change in current allergies ;  Medications Reviewed: Medications reviewed - no changes ;  Lipids: Normal lipid levels ; completed results pending Smoking: Life-long non-smoker ;  Physical Activity: Exercises at least 3 times per week ; No  Alcohol/Drug Use: Is a social-drinker ; No illicit drug use ; No Patient is not afflicted from Stress Incontinence and Urge Incontinence  Safety: reviewed ; Patient wears a seat belt, has smoke detectors, has carbon monoxide detectors, practices appropriate gun safety, and wears sunscreen with extended sun exposure. Dental Care: biannual cleanings, brushes and flosses daily. Ophthalmology/Optometry: Annual visit.  Hearing loss: none Vision impairments: none   Past Medical History:  Diagnosis Date   Depression    Frequent headaches    History of chickenpox    Vitamin D deficiency     History reviewed. No pertinent surgical history.  Family History  Problem Relation Age of Onset   Diabetes Mother    Hypertension Mother    Stroke Mother    Hypertension Brother     Social History   Socioeconomic History   Marital status: Divorced    Spouse name: Not on file   Number of children: 2   Years of education: 45   Highest education level: Master's degree (e.g., MA, MS, MEng, MEd, MSW, MBA)  Occupational History   Not on file  Tobacco Use   Smoking status: Former    Packs/day: 0.50    Years: 10.00    Pack  years: 5.00    Types: Cigarettes    Quit date: 2004    Years since quitting: 19.0   Smokeless tobacco: Never  Vaping Use   Vaping Use: Never used  Substance and Sexual Activity   Alcohol use: Yes    Alcohol/week: 2.0 standard drinks    Types: 2 Cans of beer per week    Comment: once a week   Drug use: No   Sexual activity: Yes    Birth control/protection: None, Condom  Other Topics Concern   Not on file  Social History Narrative   Not on file   Social Determinants of Health   Financial Resource Strain: Medium Risk   Difficulty of Paying Living Expenses: Somewhat hard  Food Insecurity: No Food Insecurity   Worried About Programme researcher, broadcasting/film/video in the Last Year: Never true   Ran Out of Food in the Last Year: Never true  Transportation Needs: No Transportation Needs   Lack of Transportation (Medical): No   Lack of Transportation (Non-Medical): No  Physical Activity: Insufficiently Active   Days of Exercise per Week: 1 day   Minutes of Exercise per Session: 10 min  Stress: No Stress Concern Present   Feeling of Stress : Only a little  Social Connections: Socially Isolated   Frequency of Communication with Friends and Family: More than three times a week   Frequency of Social Gatherings with Friends and Family: Once a  week   Attends Religious Services: Never   Active Member of Clubs or Organizations: No   Attends Engineer, structuralClub or Organization Meetings: Never   Marital Status: Divorced  Catering managerntimate Partner Violence: Unknown   Fear of Current or Ex-Partner: Patient refused   Emotionally Abused: No   Physically Abused: No   Sexually Abused: No    Outpatient Medications Prior to Visit  Medication Sig Dispense Refill   COVID-19 mRNA Vac-TriS, Pfizer, (PFIZER-BIONT COVID-19 VAC-TRIS) SUSP injection Inject into the muscle. 0.3 mL 0   No facility-administered medications prior to visit.    No Known Allergies  ROS Review of Systems  Constitutional: Negative.   HENT: Negative.    Eyes:  Negative.   Respiratory: Negative.    Cardiovascular: Negative.   Musculoskeletal: Negative.   Skin:  Negative for rash.  Neurological: Negative.   Hematological: Negative.   Psychiatric/Behavioral: Negative.    All other systems reviewed and are negative.    Objective:    Physical Exam Vitals and nursing note reviewed.  Constitutional:      Appearance: Normal appearance.  HENT:     Head: Normocephalic.     Mouth/Throat:     Mouth: Mucous membranes are moist.  Eyes:     Conjunctiva/sclera: Conjunctivae normal.  Cardiovascular:     Rate and Rhythm: Normal rate and regular rhythm.     Pulses: Normal pulses.     Heart sounds: Normal heart sounds.  Pulmonary:     Effort: Pulmonary effort is normal.     Breath sounds: Normal breath sounds.  Abdominal:     General: Bowel sounds are normal.  Musculoskeletal:        General: Normal range of motion.  Skin:    General: Skin is warm.     Findings: No rash.  Neurological:     Mental Status: She is alert and oriented to person, place, and time.  Psychiatric:        Behavior: Behavior normal.  Who  BP 121/64    Pulse 63    Temp 98 F (36.7 C) (Temporal)    Ht 5\' 6"  (1.676 m)    Wt 201 lb (91.2 kg)    BMI 32.44 kg/m  Wt Readings from Last 3 Encounters:  08/12/21 201 lb (91.2 kg)  07/22/21 201 lb 8 oz (91.4 kg)  07/11/20 197 lb 8 oz (89.6 kg)     There are no preventive care reminders to display for this patient.  There are no preventive care reminders to display for this patient.  Lab Results  Component Value Date   TSH 3.30 09/14/2017   Lab Results  Component Value Date   WBC 7.8 07/11/2020   HGB 12.4 07/11/2020   HCT 38.2 07/11/2020   MCV 84 07/11/2020   PLT 262 07/11/2020   Lab Results  Component Value Date   NA 141 07/11/2020   K 4.3 07/11/2020   CO2 26 07/11/2020   GLUCOSE 87 07/11/2020   BUN 13 07/11/2020   CREATININE 0.71 07/11/2020   BILITOT 0.2 07/11/2020   ALKPHOS 60 07/11/2020   AST 20  07/11/2020   ALT 18 07/11/2020   PROT 6.7 07/11/2020   ALBUMIN 4.0 07/11/2020   CALCIUM 9.4 07/11/2020   GFR 89.87 09/14/2017   Lab Results  Component Value Date   CHOL 236 (H) 07/11/2020   Lab Results  Component Value Date   HDL 60 07/11/2020   Lab Results  Component Value Date   LDLCALC 140 (H) 07/11/2020  Lab Results  Component Value Date   TRIG 203 (H) 07/11/2020   Lab Results  Component Value Date   CHOLHDL 3.9 07/11/2020   Lab Results  Component Value Date   HGBA1C 5.4 09/14/2017      Assessment & Plan:   Problem List Items Addressed This Visit       Other   Annual physical exam - Primary    Completed head to toe assessment patient has no new concerns.  Provided education on health maintenance and preventative care.  Patient declined hepatitis A, HIV screening, Shingrix and flu shot today. Education provided printed handouts given.  Follow-up in 1 year.  Labs completed today CBC, CMP and lipid panel-results pending.      Relevant Orders   CBC with Differential   Comprehensive metabolic panel   Lipid Panel      Follow-up: Return in about 1 year (around 08/12/2022).    Daryll Drown, NP

## 2021-08-12 NOTE — Assessment & Plan Note (Signed)
Completed head to toe assessment patient has no new concerns.  Provided education on health maintenance and preventative care.  Patient declined hepatitis A, HIV screening, Shingrix and flu shot today. Education provided printed handouts given.  Follow-up in 1 year.  Labs completed today CBC, CMP and lipid panel-results pending.

## 2021-08-12 NOTE — Patient Instructions (Signed)

## 2021-08-26 ENCOUNTER — Other Ambulatory Visit: Payer: Self-pay | Admitting: Nurse Practitioner

## 2021-08-26 DIAGNOSIS — Z9889 Other specified postprocedural states: Secondary | ICD-10-CM

## 2021-09-30 ENCOUNTER — Other Ambulatory Visit: Payer: Self-pay

## 2021-09-30 ENCOUNTER — Ambulatory Visit
Admission: RE | Admit: 2021-09-30 | Discharge: 2021-09-30 | Disposition: A | Discharge status: Home / Self Care | Payer: Medicaid Other | Source: Ambulatory Visit | Attending: Nurse Practitioner | Admitting: Nurse Practitioner

## 2021-09-30 DIAGNOSIS — R921 Mammographic calcification found on diagnostic imaging of breast: Secondary | ICD-10-CM | POA: Diagnosis not present

## 2021-09-30 DIAGNOSIS — R922 Inconclusive mammogram: Secondary | ICD-10-CM | POA: Diagnosis not present

## 2021-09-30 DIAGNOSIS — Z9889 Other specified postprocedural states: Secondary | ICD-10-CM

## 2022-04-08 IMAGING — MG DIGITAL DIAGNOSTIC BILAT W/ TOMO W/ CAD
6 of 10 series · 6 of 26 positions shown · non-contrast
Comparison: Previous exam(s).

CLINICAL DATA: [DATE] month follow-up for probably benign LEFT
breast calcifications. Patient had stereotactic biopsy of LEFT
breast asymmetry performed 09/24/2020, demonstrating benign
fibrocystic changes with apocrine metaplasia and calcifications.

EXAM:
DIGITAL DIAGNOSTIC BILATERAL MAMMOGRAM WITH TOMOSYNTHESIS AND CAD
TECHNIQUE: Bilateral digital diagnostic mammography and breast tomosynthesis
was performed. The images were evaluated with computer-aided
detection.

[L CC]
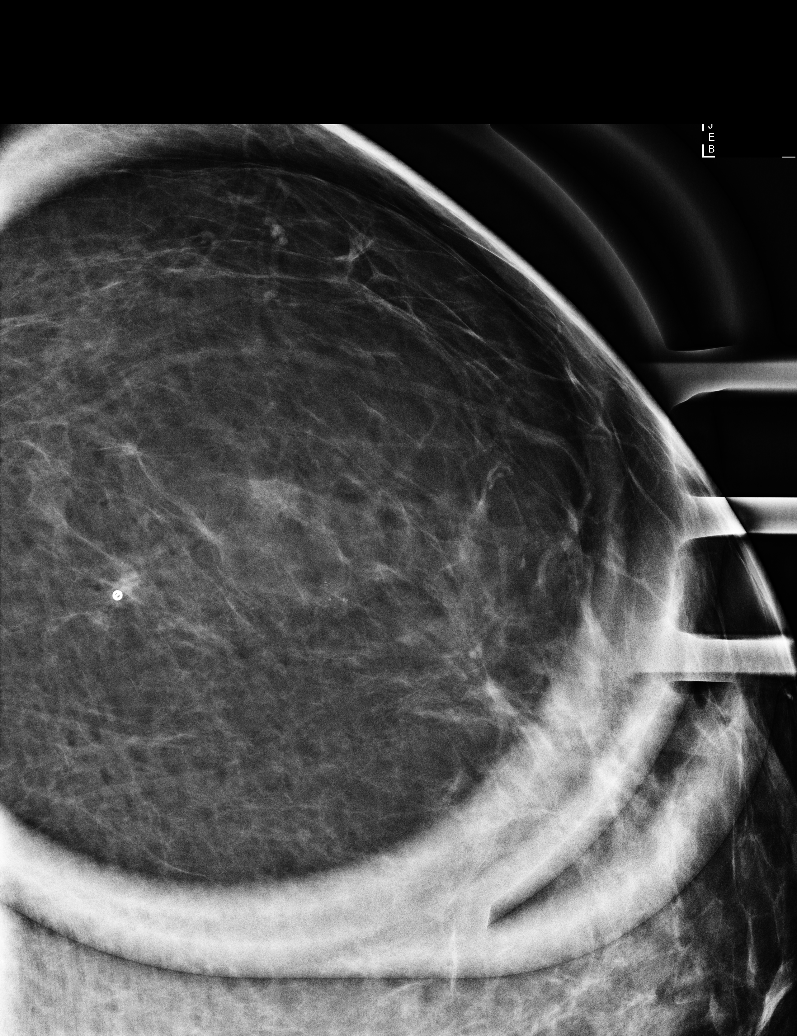

[L ML]
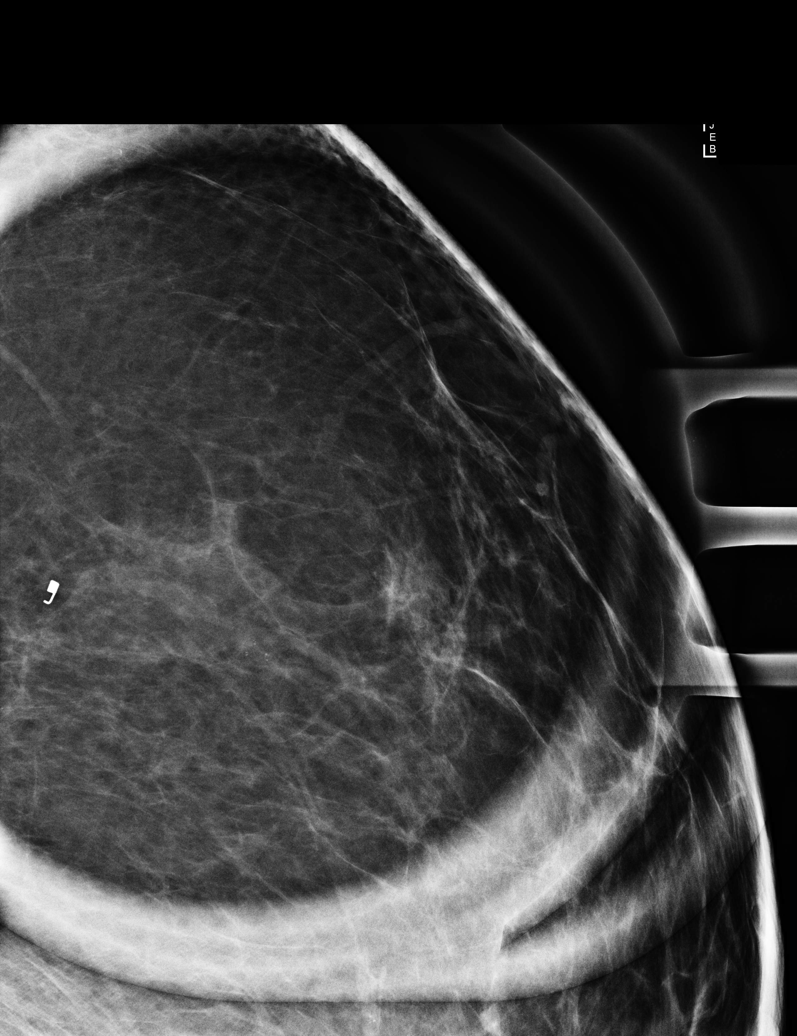

[R CC synth-2D]
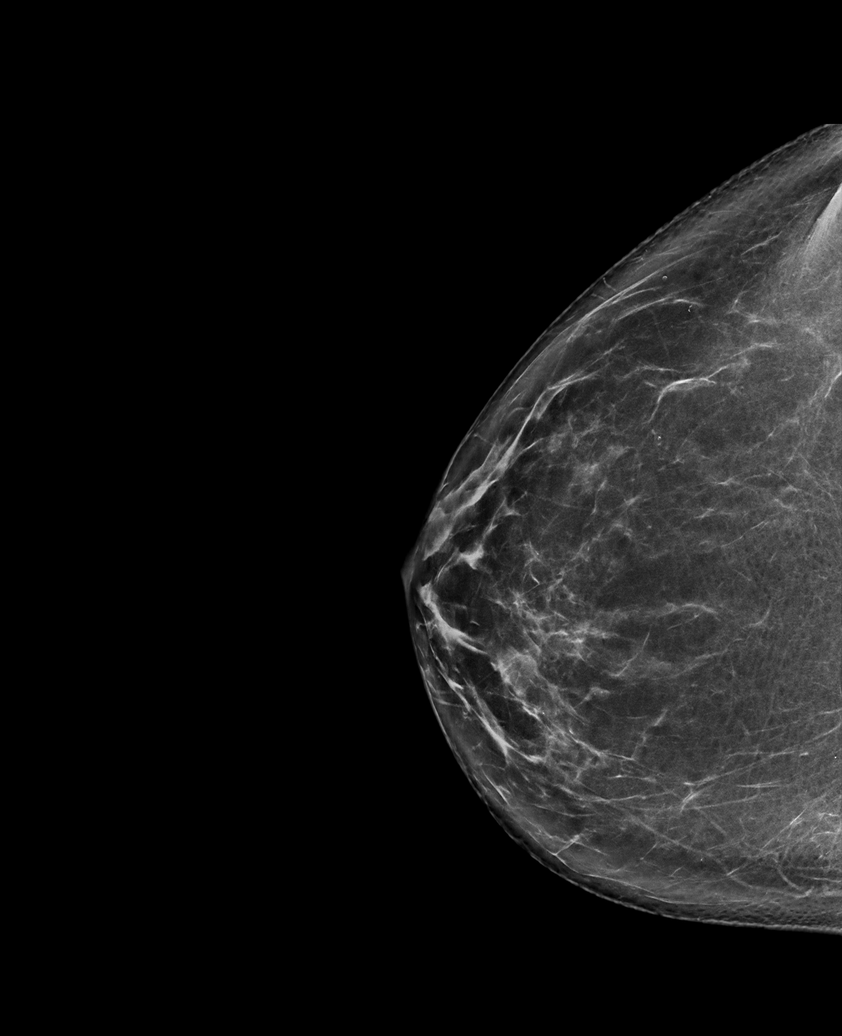

[R MLO synth-2D]
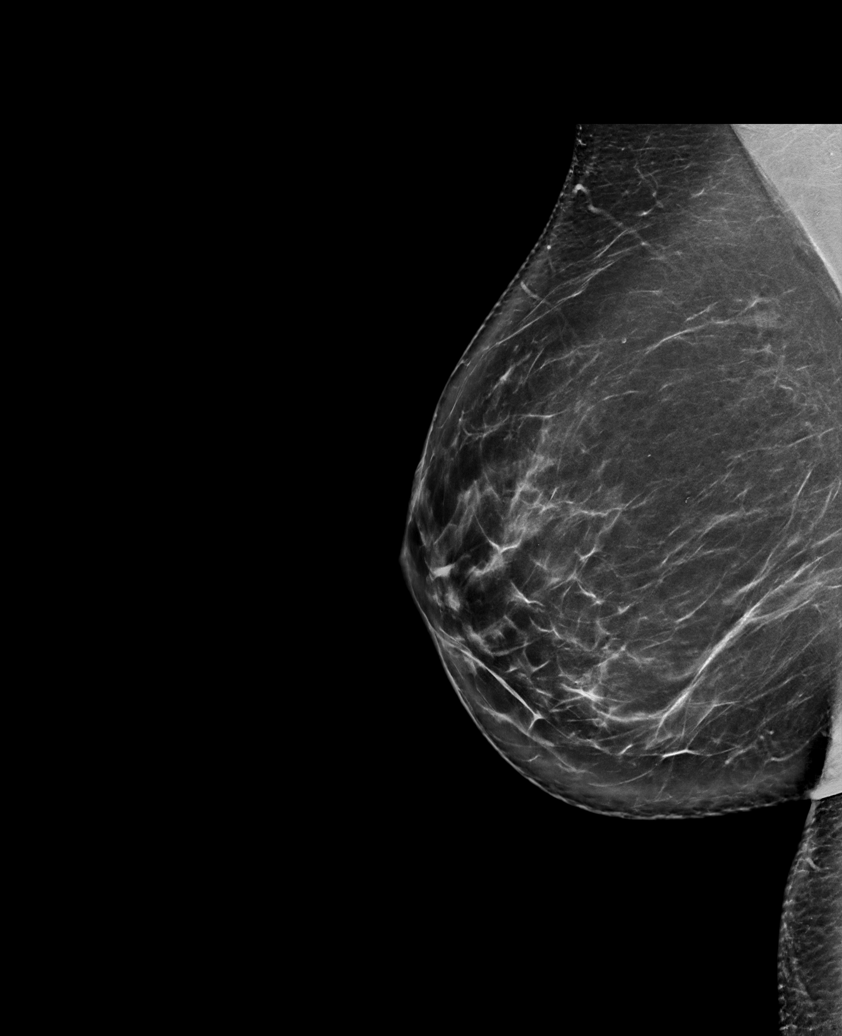

[L CC synth-2D]
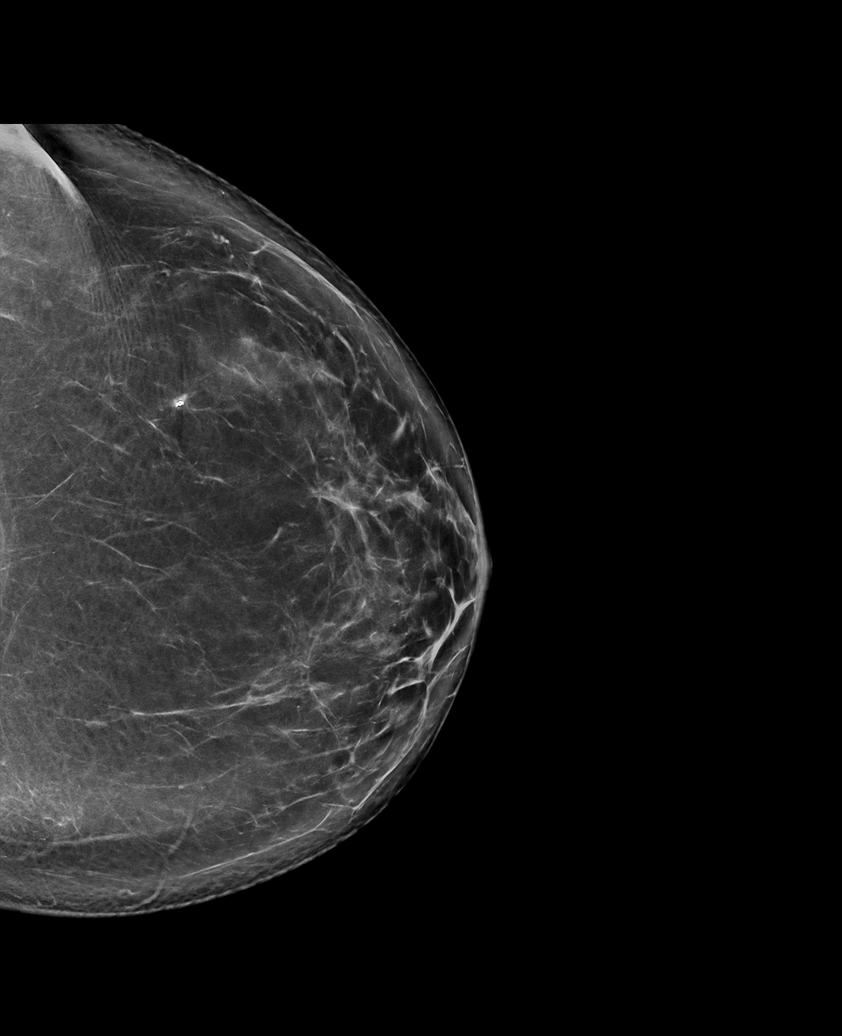

[L MLO synth-2D]
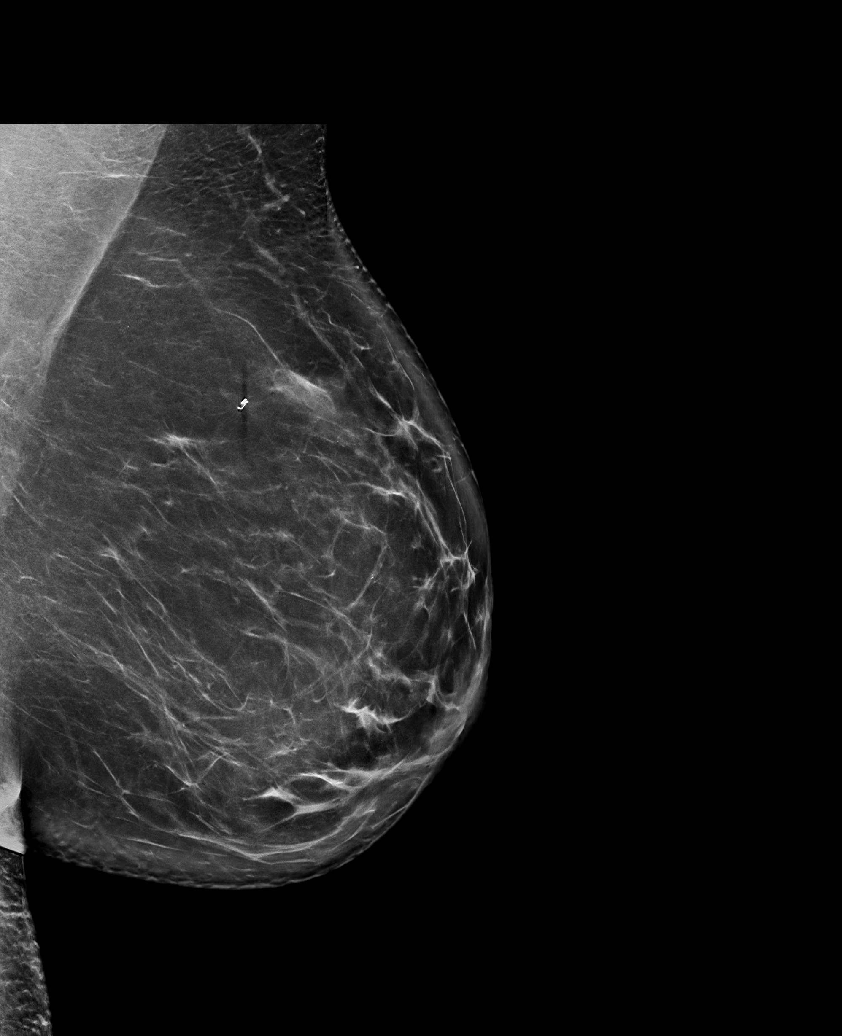

[6 of 26 positions shown; findings below may reference images not displayed]

ACR Breast Density Category b: There are scattered areas of
fibroglandular density.
FINDINGS: RIGHT breast is negative.

Coil shaped tissue marker clip is identified in the UPPER-OUTER
QUADRANT of the LEFT breast at the site of previously biopsy
asymmetry. Magnified views are performed of calcifications just
anterior to the biopsy clip. These views show round calcifications
spanning 3 millimeters in the UPPER-OUTER QUADRANT. The appearance
is stable.
IMPRESSION: Stable benign-appearing LEFT breast calcifications.

RIGHT breast is negative.

RECOMMENDATION:
Recommend bilateral diagnostic mammogram in 1 year to complete
follow-up calcifications in the LEFT breast.

I have discussed the findings and recommendations with the patient.
If applicable, a reminder letter will be sent to the patient
regarding the next appointment.

BI-RADS CATEGORY  3: Probably benign.

## 2022-04-15 ENCOUNTER — Encounter: Payer: Self-pay | Admitting: Family Medicine

## 2022-04-15 ENCOUNTER — Ambulatory Visit: Payer: Medicaid Other | Admitting: Family Medicine

## 2022-04-15 VITALS — BP 124/73 | HR 84 | Temp 98.5°F | Ht 66.0 in | Wt 203.0 lb

## 2022-04-15 DIAGNOSIS — K120 Recurrent oral aphthae: Secondary | ICD-10-CM | POA: Diagnosis not present

## 2022-04-15 DIAGNOSIS — R490 Dysphonia: Secondary | ICD-10-CM

## 2022-04-15 NOTE — Progress Notes (Signed)
   Acute Office Visit  Subjective:     Patient ID: Kimberly Ayala, female    DOB: July 16, 1966, 56 y.o.   MRN: [id number]  Chief Complaint  Patient presents with   Hoarse    Lost voice yest     HPI Patient is in today for hoarseness that started yesterday. She denies sore throat, fever, chills, cough, congestion, runny nose, dysphasia, mouth pain, or heartburn. She has not taken anything for her symptoms. She did noticed a small sore of her lower gum line. This area is not painful but it is a little tender if she runs her tongue over it.   ROS As per HPI.      Objective:    BP 124/73   Pulse 84   Temp 98.5 F (36.9 C)   Ht 5\' 6"  (1.676 m)   Wt 203 lb (92.1 kg)   LMP 03/23/2022   SpO2 96%   BMI 32.77 kg/m    Physical Exam Vitals and nursing note reviewed.  Constitutional:      General: She is not in acute distress.    Appearance: She is not ill-appearing, toxic-appearing or diaphoretic.  HENT:     Head: Normocephalic and atraumatic.     Nose: Nose normal.     Mouth/Throat:     Mouth: Mucous membranes are moist.     Dentition: No gingival swelling or dental abscesses.     Tongue: No lesions.     Pharynx: Oropharynx is clear. Uvula midline. No pharyngeal swelling, oropharyngeal exudate, posterior oropharyngeal erythema or uvula swelling.     Tonsils: No tonsillar exudate or tonsillar abscesses. 1+ on the right. 1+ on the left.     Comments: Tiny canker sores present along lower right gum line.  Eyes:     Extraocular Movements: Extraocular movements intact.     Conjunctiva/sclera: Conjunctivae normal.     Pupils: Pupils are equal, round, and reactive to light.  Cardiovascular:     Rate and Rhythm: Normal rate and regular rhythm.     Heart sounds: Normal heart sounds. No murmur heard. Pulmonary:     Effort: Pulmonary effort is normal. No respiratory distress.  Musculoskeletal:     Right lower leg: No edema.     Left lower leg: No edema.  Skin:    General: Skin is  warm and dry.  Neurological:     General: No focal deficit present.     Mental Status: She is alert and oriented to person, place, and time.  Psychiatric:        Mood and Affect: Mood normal.        Behavior: Behavior normal.     No results found for any visits on 04/15/22.      Assessment & Plan:   Kailly was seen today for hoarse.  Diagnoses and all orders for this visit:  Hoarseness of voice No other symptoms. Discussed viral laryngitis. Discussed advil, rest, hydration. Return to office for new or worsening symptoms, or if symptoms persist.   Canker sore Discussed symptomatic treatment.   Return to office for new or worsening symptoms, or if symptoms persist.   The patient indicates understanding of these issues and agrees with the plan.  06/15/22, FNP

## 2022-04-28 DIAGNOSIS — S61012A Laceration without foreign body of left thumb without damage to nail, initial encounter: Secondary | ICD-10-CM | POA: Diagnosis not present

## 2022-08-23 ENCOUNTER — Encounter: Payer: Self-pay | Admitting: Nurse Practitioner

## 2022-08-23 ENCOUNTER — Ambulatory Visit (INDEPENDENT_AMBULATORY_CARE_PROVIDER_SITE_OTHER): Payer: Medicaid Other | Admitting: Nurse Practitioner

## 2022-08-23 VITALS — BP 126/82 | HR 64 | Temp 98.5°F | Ht 66.0 in | Wt 209.0 lb

## 2022-08-23 DIAGNOSIS — Z0000 Encounter for general adult medical examination without abnormal findings: Secondary | ICD-10-CM | POA: Diagnosis not present

## 2022-08-23 LAB — LIPID PANEL

## 2022-08-23 NOTE — Progress Notes (Signed)
Established Patient Office Visit  Subjective   Patient ID: Kimberly Ayala, female    DOB: 01-03-1966  Age: 57 y.o. MRN: [id number]  Chief Complaint  Patient presents with   Annual Exam    HPI  Encounter for general adult medical examination  Physical: Patient's last physical exam was 1 year ago .  Weight: Appropriate for height (BMI greater than 27%) ;  Blood Pressure: Normal (BP less than 120/80) ;  Medical History: Patient history reviewed ; Family history reviewed ;  Allergies Reviewed: No change in current allergies ;  Medications Reviewed: Medications reviewed - no changes ;  Lipids: Normal lipid levels ; Labs completed  Smoking: Life-long non-smoker ;  Physical Activity: Exercises at least 3 times per week ; No Alcohol/Drug Use: Is a social drinker ; No illicit drug use ;  Patient is not afflicted from Stress Incontinence and Urge Incontinence  Safety: reviewed ; Patient wears a seat belt, has smoke detectors, has carbon monoxide detectors, practices appropriate gun safety, and wears sunscreen with extended sun exposure. Dental Care: biannual cleanings, brushes and flosses daily. Ophthalmology/Optometry: Annual visit.  Hearing loss: none Vision impairments: none    Patient Active Problem List   Diagnosis Date Noted   Annual physical exam 09/14/2017   Obesity (BMI 30-39.9) 09/14/2017   Cyst of breast 09/14/2017   Past Medical History:  Diagnosis Date   Depression    Frequent headaches    History of chickenpox    Vitamin D deficiency    Past Surgical History:  Procedure Laterality Date   BREAST BIOPSY Left 09/24/2020   fibrocystic change w/ apocrine metaplasia and calcifications   Social History   Tobacco Use   Smoking status: Former    Packs/day: 0.50    Years: 10.00    Total pack years: 5.00    Types: Cigarettes    Quit date: 2004    Years since quitting: 20.0   Smokeless tobacco: Never  Vaping Use   Vaping Use: Never used  Substance Use Topics    Alcohol use: Yes    Alcohol/week: 2.0 standard drinks of alcohol    Types: 2 Cans of beer per week    Comment: once a week   Drug use: No   Social History   Socioeconomic History   Marital status: Divorced    Spouse name: Not on file   Number of children: 2   Years of education: 18   Highest education level: Master's degree (e.g., MA, MS, MEng, MEd, MSW, MBA)  Occupational History   Not on file  Tobacco Use   Smoking status: Former    Packs/day: 0.50    Years: 10.00    Total pack years: 5.00    Types: Cigarettes    Quit date: 2004    Years since quitting: 20.0   Smokeless tobacco: Never  Vaping Use   Vaping Use: Never used  Substance and Sexual Activity   Alcohol use: Yes    Alcohol/week: 2.0 standard drinks of alcohol    Types: 2 Cans of beer per week    Comment: once a week   Drug use: No   Sexual activity: Yes    Birth control/protection: None, Condom  Other Topics Concern   Not on file  Social History Narrative   Not on file   Social Determinants of Health   Financial Resource Strain: Medium Risk (07/22/2021)   Overall Financial Resource Strain (CARDIA)    Difficulty of Paying Living Expenses: Somewhat hard  Food Insecurity: No Food Insecurity (07/22/2021)   Hunger Vital Sign    Worried About Running Out of Food in the Last Year: Never true    Ran Out of Food in the Last Year: Never true  Transportation Needs: No Transportation Needs (07/22/2021)   PRAPARE - Hydrologist (Medical): No    Lack of Transportation (Non-Medical): No  Physical Activity: Insufficiently Active (07/22/2021)   Exercise Vital Sign    Days of Exercise per Week: 1 day    Minutes of Exercise per Session: 10 min  Stress: No Stress Concern Present (07/22/2021)   [place]    Feeling of Stress : Only a little  Social Connections: Socially Isolated (07/22/2021)   Social Connection and  Isolation Panel [NHANES]    Frequency of Communication with Friends and Family: More than three times a week    Frequency of Social Gatherings with Friends and Family: Once a week    Attends Religious Services: Never    Marine scientist or Organizations: No    Attends Archivist Meetings: Never    Marital Status: Divorced  Human resources officer Violence: Unknown (07/22/2021)   Humiliation, Afraid, Rape, and Kick questionnaire    Fear of Current or Ex-Partner: Patient refused    Emotionally Abused: No    Physically Abused: No    Sexually Abused: No   Family Status  Relation Name Status   Mother  Alive   Father  Alive   Brother  Alive   MGM  Deceased   MGF  Deceased   PGM  Deceased   PGF  Deceased   Son  Alive   Son  Alive   Family History  Problem Relation Age of Onset   Diabetes Mother    Hypertension Mother    Stroke Mother    Hypertension Brother    No Known Allergies    Review of Systems  Constitutional: Negative.   HENT: Negative.    Eyes: Negative.   Respiratory: Negative.    Cardiovascular: Negative.   Gastrointestinal: Negative.   Genitourinary: Negative.   Musculoskeletal: Negative.   Skin: Negative.   Neurological: Negative.   Psychiatric/Behavioral: Negative.    All other systems reviewed and are negative.     Objective:     BP 126/82   Pulse 64   Temp 98.5 F (36.9 C)   Ht 5\' 6"  (1.676 m)   Wt 209 lb (94.8 kg)   SpO2 99%   BMI 33.73 kg/m  BP Readings from Last 3 Encounters:  08/23/22 126/82  04/15/22 124/73  08/12/21 121/64   Wt Readings from Last 3 Encounters:  08/23/22 209 lb (94.8 kg)  04/15/22 203 lb (92.1 kg)  08/12/21 201 lb (91.2 kg)      Physical Exam Vitals and nursing note reviewed.  Constitutional:      Appearance: Normal appearance. She is obese.  HENT:     Head: Normocephalic.     Right Ear: Ear canal and external ear normal.     Left Ear: Ear canal and external ear normal.     Nose: Nose normal.      Mouth/Throat:     Pharynx: Oropharynx is clear.  Eyes:     Conjunctiva/sclera: Conjunctivae normal.  Cardiovascular:     Rate and Rhythm: Normal rate and regular rhythm.     Pulses: Normal pulses.     Heart sounds: Normal heart sounds.  Pulmonary:  Effort: Pulmonary effort is normal.     Breath sounds: Normal breath sounds.  Abdominal:     General: Bowel sounds are normal.  Musculoskeletal:        General: Normal range of motion.  Skin:    General: Skin is warm.     Findings: No erythema or rash.  Neurological:     General: No focal deficit present.     Mental Status: She is alert and oriented to person, place, and time.  Psychiatric:        Mood and Affect: Mood normal.        Behavior: Behavior normal.      No results found for any visits on 08/23/22.  Last CBC Lab Results  Component Value Date   WBC 8.1 08/12/2021   HGB 12.5 08/12/2021   HCT 38.2 08/12/2021   MCV 85 08/12/2021   MCH 27.7 08/12/2021   RDW 12.7 08/12/2021   PLT 247 72/53/6644   Last metabolic panel Lab Results  Component Value Date   GLUCOSE 91 08/12/2021   NA 143 08/12/2021   K 4.4 08/12/2021   CL 106 08/12/2021   CO2 22 08/12/2021   BUN 12 08/12/2021   CREATININE 0.70 08/12/2021   EGFR 103 08/12/2021   CALCIUM 9.3 08/12/2021   PROT 6.6 08/12/2021   ALBUMIN 3.8 08/12/2021   LABGLOB 2.8 08/12/2021   AGRATIO 1.4 08/12/2021   BILITOT 0.2 08/12/2021   ALKPHOS 57 08/12/2021   AST 19 08/12/2021   ALT 18 08/12/2021   Last lipids Lab Results  Component Value Date   CHOL 225 (H) 08/12/2021   HDL 59 08/12/2021   LDLCALC 137 (H) 08/12/2021   TRIG 161 (H) 08/12/2021   CHOLHDL 3.8 08/12/2021      The 10-year ASCVD risk score (Arnett DK, et al., 2019) is: 1.9%    Assessment & Plan:   Problem List Items Addressed This Visit       Other   Annual physical exam - Primary    Completed head to to assessment. Patient has no new concerns. Labs completed results pending. Education  provided on health maintenance and preventative care      Relevant Orders   CBC with Differential/Platelet   CMP14+EGFR   HCV Ab w Reflex to Quant PCR   Lipid panel   TSH   HIV Antibody (routine testing w rflx)   Hepatitis C Antibody   VITAMIN D 25 Hydroxy (Vit-D Deficiency, Fractures)   MM Digital Screening    Return in about 1 year (around 08/24/2023) for annaual physical .    Ivy Lynn, NP

## 2022-08-23 NOTE — Assessment & Plan Note (Signed)
MM completed: results pending

## 2022-08-23 NOTE — Assessment & Plan Note (Signed)
Completed head to to assessment. Patient has no new concerns. Labs completed results pending. Education provided on health maintenance and preventative care

## 2022-08-23 NOTE — Patient Instructions (Signed)

## 2022-08-24 ENCOUNTER — Other Ambulatory Visit: Payer: Self-pay | Admitting: Nurse Practitioner

## 2022-08-24 ENCOUNTER — Encounter: Payer: Self-pay | Admitting: Nurse Practitioner

## 2022-08-24 DIAGNOSIS — E782 Mixed hyperlipidemia: Secondary | ICD-10-CM

## 2022-08-24 DIAGNOSIS — E559 Vitamin D deficiency, unspecified: Secondary | ICD-10-CM

## 2022-08-24 LAB — CMP14+EGFR
ALT: 20 IU/L (ref 0–32)
AST: 18 IU/L (ref 0–40)
Albumin/Globulin Ratio: 1.5 (ref 1.2–2.2)
Albumin: 3.7 g/dL — ABNORMAL LOW (ref 3.8–4.9)
Alkaline Phosphatase: 65 IU/L (ref 44–121)
BUN/Creatinine Ratio: 19 (ref 9–23)
BUN: 12 mg/dL (ref 6–24)
Bilirubin Total: 0.3 mg/dL (ref 0.0–1.2)
CO2: 22 mmol/L (ref 20–29)
Calcium: 8.9 mg/dL (ref 8.7–10.2)
Chloride: 103 mmol/L (ref 96–106)
Creatinine, Ser: 0.62 mg/dL (ref 0.57–1.00)
Globulin, Total: 2.5 g/dL (ref 1.5–4.5)
Glucose: 87 mg/dL (ref 70–99)
Potassium: 4.2 mmol/L (ref 3.5–5.2)
Sodium: 138 mmol/L (ref 134–144)
Total Protein: 6.2 g/dL (ref 6.0–8.5)
eGFR: 106 mL/min/{1.73_m2} (ref >59)

## 2022-08-24 LAB — CBC WITH DIFFERENTIAL/PLATELET
Basophils Absolute: 0.1 10*3/uL (ref 0.0–0.2)
Basos: 1 %
EOS (ABSOLUTE): 0.2 10*3/uL (ref 0.0–0.4)
Eos: 3 %
Hematocrit: 37.9 % (ref 34.0–46.6)
Hemoglobin: 12.7 g/dL (ref 11.1–15.9)
Immature Grans (Abs): 0 10*3/uL (ref 0.0–0.1)
Immature Granulocytes: 0 %
Lymphocytes Absolute: 2.3 10*3/uL (ref 0.7–3.1)
Lymphs: 32 %
MCH: 27.3 pg (ref 26.6–33.0)
MCHC: 33.5 g/dL (ref 31.5–35.7)
MCV: 81 fL (ref 79–97)
Monocytes Absolute: 0.6 10*3/uL (ref 0.1–0.9)
Monocytes: 8 %
Neutrophils Absolute: 4 10*3/uL (ref 1.4–7.0)
Neutrophils: 56 %
Platelets: 208 10*3/uL (ref 150–450)
RBC: 4.66 x10E6/uL (ref 3.77–5.28)
RDW: 12.8 % (ref 11.7–15.4)
WBC: 7.1 10*3/uL (ref 3.4–10.8)

## 2022-08-24 LAB — VITAMIN D 25 HYDROXY (VIT D DEFICIENCY, FRACTURES): Vit D, 25-Hydroxy: 12 ng/mL — ABNORMAL LOW (ref 30.0–100.0)

## 2022-08-24 LAB — LIPID PANEL
Chol/HDL Ratio: 3.7 ratio (ref 0.0–4.4)
Cholesterol, Total: 228 mg/dL — ABNORMAL HIGH (ref 100–199)
HDL: 61 mg/dL (ref >39)
LDL Chol Calc (NIH): 132 mg/dL — ABNORMAL HIGH (ref 0–99)
Triglycerides: 199 mg/dL — ABNORMAL HIGH (ref 0–149)
VLDL Cholesterol Cal: 35 mg/dL (ref 5–40)

## 2022-08-24 LAB — TSH: TSH: 3.51 u[IU]/mL (ref 0.450–4.500)

## 2022-08-24 LAB — HCV INTERPRETATION

## 2022-08-24 LAB — HIV ANTIBODY (ROUTINE TESTING W REFLEX): HIV Screen 4th Generation wRfx: NONREACTIVE

## 2022-08-24 LAB — HEPATITIS C ANTIBODY: Hep C Virus Ab: NONREACTIVE

## 2022-08-24 LAB — HCV AB W REFLEX TO QUANT PCR: HCV Ab: NONREACTIVE

## 2022-08-24 MED ORDER — OMEGA-3 FATTY ACIDS 1000 MG PO CAPS: 2.0000 g | ORAL_CAPSULE | Freq: Every day | ORAL | 1 refills | Status: DC

## 2022-08-24 MED ORDER — VITAMIN D (ERGOCALCIFEROL) 1.25 MG (50000 UNIT) PO CAPS: 50000.0000 [IU] | ORAL_CAPSULE | ORAL | 0 refills | Status: DC

## 2022-08-24 MED ORDER — ROSUVASTATIN CALCIUM 10 MG PO TABS: 10.0000 mg | ORAL_TABLET | Freq: Every day | ORAL | 3 refills | Status: DC

## 2022-08-25 ENCOUNTER — Other Ambulatory Visit: Payer: Self-pay | Admitting: *Deleted

## 2022-08-25 DIAGNOSIS — Z1231 Encounter for screening mammogram for malignant neoplasm of breast: Secondary | ICD-10-CM

## 2022-08-30 ENCOUNTER — Encounter: Payer: Self-pay | Admitting: Family Medicine

## 2022-08-30 ENCOUNTER — Ambulatory Visit: Payer: Medicaid Other | Admitting: Family Medicine

## 2022-08-30 VITALS — BP 132/83 | HR 88 | Temp 98.0°F | Ht 66.0 in | Wt 209.0 lb

## 2022-08-30 DIAGNOSIS — J329 Chronic sinusitis, unspecified: Secondary | ICD-10-CM | POA: Diagnosis not present

## 2022-08-30 DIAGNOSIS — J40 Bronchitis, not specified as acute or chronic: Secondary | ICD-10-CM | POA: Diagnosis not present

## 2022-08-30 MED ORDER — AZITHROMYCIN 250 MG PO TABS: ORAL_TABLET | ORAL | 0 refills | Status: DC

## 2022-08-30 NOTE — Progress Notes (Signed)
Subjective:  Patient ID: Kimberly Ayala, female    DOB: 1966/01/15  Age: 57 y.o. MRN: [id number]  CC: Wheezing   HPI Kimberly Ayala presents for rattling with each breath. Very little cough. Fever 1 week ago. None since. No sore throa or earache. Not dyspneic, just feels ight like mucous that needs tocome up but won't.     08/30/2022    9:48 AM 08/23/2022    9:56 AM 04/15/2022    4:03 PM  Depression screen PHQ 2/9  Decreased Interest 0 0 0  Down, Depressed, Hopeless 0 0 0  PHQ - 2 Score 0 0 0  Altered sleeping  0   Tired, decreased energy  0   Change in appetite  0   Feeling bad or failure about yourself   0   Trouble concentrating  0   Moving slowly or fidgety/restless  0   Suicidal thoughts  0   PHQ-9 Score  0   Difficult doing work/chores  Not difficult at all     History Kimberly Ayala has a past medical history of Depression, Frequent headaches, History of chickenpox, and Vitamin D deficiency.   She has a past surgical history that includes Breast biopsy (Left, 09/24/2020).   Her family history includes Diabetes in her mother; Hypertension in her brother and mother; Stroke in her mother.She reports that she quit smoking about 20 years ago. Her smoking use included cigarettes. She has a 5.00 pack-year smoking history. She has never used smokeless tobacco. She reports current alcohol use of about 2.0 standard drinks of alcohol per week. She reports that she does not use drugs.    ROS Review of Systems  Constitutional:  Negative for appetite change, chills, diaphoresis and fatigue.  HENT:  Negative for ear pain, rhinorrhea and sore throat.   Respiratory:  Positive for chest tightness. Negative for shortness of breath.   Cardiovascular:  Negative for chest pain and palpitations.  Skin:  Negative for rash.    Objective:  BP 132/83   Pulse 88   Temp 98 F (36.7 C)   Ht 5\' 6"  (1.676 m)   Wt 209 lb (94.8 kg)   SpO2 95%   BMI 33.73 kg/m   BP Readings from Last 3 Encounters:   08/30/22 132/83  08/23/22 126/82  04/15/22 124/73    Wt Readings from Last 3 Encounters:  08/30/22 209 lb (94.8 kg)  08/23/22 209 lb (94.8 kg)  04/15/22 203 lb (92.1 kg)     Physical Exam Constitutional:      General: She is not in acute distress.    Appearance: She is well-developed.  Cardiovascular:     Rate and Rhythm: Normal rate and regular rhythm.  Pulmonary:     Breath sounds: Normal breath sounds.  Musculoskeletal:        General: Normal range of motion.  Skin:    General: Skin is warm and dry.  Neurological:     Mental Status: She is alert and oriented to person, place, and time.       Assessment & Plan:   Kimberly Ayala was seen today for wheezing.  Diagnoses and all orders for this visit:  Sinobronchitis  Other orders -     azithromycin (ZITHROMAX Z-PAK) 250 MG tablet; Take two right away Then one a day for the next 4 days.       I am having Karrington K. Connell start on azithromycin. I am also having her maintain her Vitamin D (Ergocalciferol), fish oil-omega-3  fatty acids, and rosuvastatin.  Allergies as of 08/30/2022   No Known Allergies      Medication List        Accurate as of August 30, 2022 10:09 AM. If you have any questions, ask your nurse or doctor.          azithromycin 250 MG tablet Commonly known as: Zithromax Z-Pak Take two right away Then one a day for the next 4 days. Started by: Claretta Fraise, MD   fish oil-omega-3 fatty acids 1000 MG capsule Take 2 capsules (2 g total) by mouth daily.   rosuvastatin 10 MG tablet Commonly known as: Crestor Take 1 tablet (10 mg total) by mouth daily.   Vitamin D (Ergocalciferol) 1.25 MG (50000 UNIT) Caps capsule Commonly known as: DRISDOL Take 1 capsule (50,000 Units total) by mouth every 7 (seven) days.         Follow-up: Return if symptoms worsen or fail to improve.  Claretta Fraise, M.D.

## 2022-10-06 ENCOUNTER — Other Ambulatory Visit: Payer: Self-pay | Admitting: Nurse Practitioner

## 2022-10-06 ENCOUNTER — Other Ambulatory Visit: Payer: Self-pay | Admitting: Family Medicine

## 2022-10-06 DIAGNOSIS — R928 Other abnormal and inconclusive findings on diagnostic imaging of breast: Secondary | ICD-10-CM

## 2022-10-06 DIAGNOSIS — R921 Mammographic calcification found on diagnostic imaging of breast: Secondary | ICD-10-CM

## 2022-10-12 ENCOUNTER — Ambulatory Visit
Admission: RE | Admit: 2022-10-12 | Discharge: 2022-10-12 | Disposition: A | Discharge status: Home / Self Care | Payer: Medicaid Other | Source: Ambulatory Visit | Attending: Family Medicine | Admitting: Family Medicine

## 2022-10-12 DIAGNOSIS — R921 Mammographic calcification found on diagnostic imaging of breast: Secondary | ICD-10-CM

## 2022-12-16 DIAGNOSIS — D2239 Melanocytic nevi of other parts of face: Secondary | ICD-10-CM | POA: Diagnosis not present

## 2022-12-16 DIAGNOSIS — D2262 Melanocytic nevi of left upper limb, including shoulder: Secondary | ICD-10-CM | POA: Diagnosis not present

## 2022-12-16 DIAGNOSIS — D2271 Melanocytic nevi of right lower limb, including hip: Secondary | ICD-10-CM | POA: Diagnosis not present

## 2023-06-01 ENCOUNTER — Encounter: Payer: Self-pay | Admitting: Nurse Practitioner

## 2023-06-01 ENCOUNTER — Ambulatory Visit: Payer: Medicaid Other | Admitting: Nurse Practitioner

## 2023-06-01 VITALS — BP 101/68 | HR 61 | Temp 97.5°F | Ht 66.0 in | Wt 202.8 lb

## 2023-06-01 DIAGNOSIS — H60313 Diffuse otitis externa, bilateral: Secondary | ICD-10-CM | POA: Insufficient documentation

## 2023-06-01 MED ORDER — CIPROFLOXACIN-DEXAMETHASONE 0.3-0.1 % OT SUSP: 4.0000 [drp] | Freq: Two times a day (BID) | OTIC | 0 refills | Status: DC

## 2023-06-01 NOTE — Progress Notes (Signed)
Established Patient Office Visit  Subjective   Patient ID: Kimberly Ayala, female    DOB: 09-14-1965  Age: 57 y.o. MRN: [id number]  Chief Complaint  Patient presents with   Ear Pain    Right ear pain for few days    HPI  57 y.o. female complains of pain in right ear for 3 days. No fever or URI symptoms. Has been swimming.   Patient Active Problem List   Diagnosis Date Noted   Acute diffuse otitis externa of both ears 06/01/2023   Annual physical exam 09/14/2017   Obesity (BMI 30-39.9) 09/14/2017   Cyst of breast 09/14/2017   Past Medical History:  Diagnosis Date   Depression    Frequent headaches    History of chickenpox    Vitamin D deficiency    Past Surgical History:  Procedure Laterality Date   BREAST BIOPSY Left 09/24/2020   fibrocystic change w/ apocrine metaplasia and calcifications   Social History   Tobacco Use   Smoking status: Former    Current packs/day: 0.00    Average packs/day: 0.5 packs/day for 10.0 years (5.0 ttl pk-yrs)    Types: Cigarettes    Start date: 32    Quit date: 2004    Years since quitting: 20.8   Smokeless tobacco: Never  Vaping Use   Vaping status: Never Used  Substance Use Topics   Alcohol use: Yes    Alcohol/week: 2.0 standard drinks of alcohol    Types: 2 Cans of beer per week    Comment: once a week   Drug use: No   Social History   Socioeconomic History   Marital status: Divorced    Spouse name: Not on file   Number of children: 2   Years of education: 72   Highest education level: Master's degree (e.g., MA, MS, MEng, MEd, MSW, MBA)  Occupational History   Not on file  Tobacco Use   Smoking status: Former    Current packs/day: 0.00    Average packs/day: 0.5 packs/day for 10.0 years (5.0 ttl pk-yrs)    Types: Cigarettes    Start date: 45    Quit date: 2004    Years since quitting: 20.8   Smokeless tobacco: Never  Vaping Use   Vaping status: Never Used  Substance and Sexual Activity   Alcohol use: Yes     Alcohol/week: 2.0 standard drinks of alcohol    Types: 2 Cans of beer per week    Comment: once a week   Drug use: No   Sexual activity: Yes    Birth control/protection: None, Condom  Other Topics Concern   Not on file  Social History Narrative   Not on file   Social Determinants of Health   Financial Resource Strain: Medium Risk (07/22/2021)   Overall Financial Resource Strain (CARDIA)    Difficulty of Paying Living Expenses: Somewhat hard  Food Insecurity: No Food Insecurity (07/22/2021)   Hunger Vital Sign    Worried About Running Out of Food in the Last Year: Never true    Ran Out of Food in the Last Year: Never true  Transportation Needs: No Transportation Needs (07/22/2021)   PRAPARE - Administrator, Civil Service (Medical): No    Lack of Transportation (Non-Medical): No  Physical Activity: Insufficiently Active (07/22/2021)   Exercise Vital Sign    Days of Exercise per Week: 1 day    Minutes of Exercise per Session: 10 min  Stress: No Stress Concern  Present (07/22/2021)   [employer] of Occupational Health - Occupational Stress Questionnaire    Feeling of Stress : Only a little  Social Connections: Socially Isolated (07/22/2021)   Social Connection and Isolation Panel [NHANES]    Frequency of Communication with Friends and Family: More than three times a week    Frequency of Social Gatherings with Friends and Family: Once a week    Attends Religious Services: Never    Database administrator or Organizations: No    Attends Banker Meetings: Never    Marital Status: Divorced  Catering manager Violence: Unknown (07/22/2021)   Humiliation, Afraid, Rape, and Kick questionnaire    Fear of Current or Ex-Partner: Patient declined    Emotionally Abused: No    Physically Abused: No    Sexually Abused: No   Family Status  Relation Name Status   Mother  Alive   Father  Alive   Brother  Alive   MGM  Deceased   MGF  Deceased   PGM   Deceased   PGF  Deceased   Son  Alive   Son  Alive  No partnership data on file   Family History  Problem Relation Age of Onset   Diabetes Mother    Hypertension Mother    Stroke Mother    Hypertension Brother    No Known Allergies    ROS Negative unless indicated in HPI   Objective:     BP 101/68   Pulse 61   Temp (!) 97.5 F (36.4 C) (Temporal)   Ht 5\' 6"  (1.676 m)   Wt 202 lb 12.8 oz (92 kg)   SpO2 97%   BMI 32.73 kg/m  BP Readings from Last 3 Encounters:  06/01/23 101/68  08/30/22 132/83  08/23/22 126/82   Wt Readings from Last 3 Encounters:  06/01/23 202 lb 12.8 oz (92 kg)  08/30/22 209 lb (94.8 kg)  08/23/22 209 lb (94.8 kg)      Physical Exam She appears well, afebrile. Bilateral ear reveals tenderness of the tragus; debris and inflammation in external canal. TM is not well seen due to debris, but visualized aspects appear normal.  No results found for any visits on 06/01/23.  Last CBC Lab Results  Component Value Date   WBC 7.1 08/23/2022   HGB 12.7 08/23/2022   HCT 37.9 08/23/2022   MCV 81 08/23/2022   MCH 27.3 08/23/2022   RDW 12.8 08/23/2022   PLT 208 08/23/2022   Last metabolic panel Lab Results  Component Value Date   GLUCOSE 87 08/23/2022   NA 138 08/23/2022   K 4.2 08/23/2022   CL 103 08/23/2022   CO2 22 08/23/2022   BUN 12 08/23/2022   CREATININE 0.62 08/23/2022   EGFR 106 08/23/2022   CALCIUM 8.9 08/23/2022   PROT 6.2 08/23/2022   ALBUMIN 3.7 (L) 08/23/2022   LABGLOB 2.5 08/23/2022   AGRATIO 1.5 08/23/2022   BILITOT 0.3 08/23/2022   ALKPHOS 65 08/23/2022   AST 18 08/23/2022   ALT 20 08/23/2022   Last lipids Lab Results  Component Value Date   CHOL 228 (H) 08/23/2022   HDL 61 08/23/2022   LDLCALC 132 (H) 08/23/2022   TRIG 199 (H) 08/23/2022   CHOLHDL 3.7 08/23/2022   Last hemoglobin A1c Lab Results  Component Value Date   HGBA1C 5.4 09/14/2017   Last thyroid functions Lab Results  Component Value Date    TSH 3.510 08/23/2022  Assessment & Plan:  Acute diffuse otitis externa of both ears -     Ciprofloxacin-dexAMETHasone; Place 4 drops into both ears 2 (two) times daily.  Dispense: 7.5 mL; Refill: 0    [name] is 57 yrs old female, no acute distress  Otitis Externa Ciprofloxacin drops  Instructed to keep ear dry until better; eardrops per orders, call if persistent pain, swelling or fever, FUV prn.   The above assessment and management plan was discussed with the patient. The patient verbalized understanding of and has agreed to the management plan. Patient is aware to call the clinic if they develop any new symptoms or if symptoms persist or worsen. Patient is aware when to return to the clinic for a follow-up visit. Patient educated on when it is appropriate to go to the emergency department.  Return if symptoms worsen or fail to improve.    Arrie Aran Santa Lighter, DNP [facility] [address] [place], [place] [address] [phone]

## 2023-08-29 ENCOUNTER — Ambulatory Visit: Payer: Medicaid Other | Admitting: Family Medicine

## 2023-08-29 ENCOUNTER — Encounter: Payer: Self-pay | Admitting: Family Medicine

## 2023-08-29 VITALS — BP 95/66 | HR 61 | Temp 97.3°F | Ht 66.0 in | Wt 197.6 lb

## 2023-08-29 DIAGNOSIS — E66811 Obesity, class 1: Secondary | ICD-10-CM

## 2023-08-29 DIAGNOSIS — Z6831 Body mass index (BMI) 31.0-31.9, adult: Secondary | ICD-10-CM

## 2023-08-29 DIAGNOSIS — E782 Mixed hyperlipidemia: Secondary | ICD-10-CM | POA: Diagnosis not present

## 2023-08-29 DIAGNOSIS — E6609 Other obesity due to excess calories: Secondary | ICD-10-CM

## 2023-08-29 DIAGNOSIS — Z0000 Encounter for general adult medical examination without abnormal findings: Secondary | ICD-10-CM

## 2023-08-29 DIAGNOSIS — E559 Vitamin D deficiency, unspecified: Secondary | ICD-10-CM | POA: Insufficient documentation

## 2023-08-29 DIAGNOSIS — Z0001 Encounter for general adult medical examination with abnormal findings: Secondary | ICD-10-CM

## 2023-08-29 LAB — BAYER DCA HB A1C WAIVED: HB A1C (BAYER DCA - WAIVED): 5 % (ref 4.8–5.6)

## 2023-08-29 NOTE — Patient Instructions (Signed)

## 2023-08-29 NOTE — Progress Notes (Signed)
Complete physical exam  Patient: Kimberly Ayala   DOB: [birth year]   58 y.o. Female  MRN: [id number]  Subjective:    Chief Complaint  Patient presents with   Annual Exam    Kimberly Ayala is a 58 y.o. female who presents today for a complete physical exam. She reports consuming a  well balanced  diet. The patient does not participate in regular exercise at present. She generally feels well. She reports sleeping well. She does not have additional problems to discuss today.   She is working on weight loss. Goal 0.5 lb a week. Weight has been trending down.   Most recent fall risk assessment:    08/29/2023    9:54 AM  Fall Risk   Falls in the past year? 0     Most recent depression screenings:    08/29/2023    9:54 AM 08/30/2022    9:48 AM 08/23/2022    9:56 AM  Depression screen PHQ 2/9  Decreased Interest 0 0 0  Down, Depressed, Hopeless 0 0 0  PHQ - 2 Score 0 0 0  Altered sleeping   0  Tired, decreased energy   0  Change in appetite   0  Feeling bad or failure about yourself    0  Trouble concentrating   0  Moving slowly or fidgety/restless   0  Suicidal thoughts   0  PHQ-9 Score   0  Difficult doing work/chores   Not difficult at all      08/29/2023    9:58 AM 08/23/2022    9:57 AM 08/12/2021    9:02 AM 07/22/2021   10:38 AM  GAD 7 : Generalized Anxiety Score  Nervous, Anxious, on Edge 1 0 0 1  Control/stop worrying 0 0 0 1  Worry too much - different things 0 0 0 0  Trouble relaxing 0 0 0 1  Restless 0 0 0 0  Easily annoyed or irritable 1 0 0 1  Afraid - awful might happen 0 0 0 1  Total GAD 7 Score 2 0 0 5  Anxiety Difficulty Not difficult at all Not difficult at all Not difficult at all       Vision:Not within last year  and Dental: No current dental problems and Receives regular dental care  Past Medical History:  Diagnosis Date   Depression    Frequent headaches    History of chickenpox    Vitamin D deficiency       Patient Care Team: Gabriel Earing, FNP as PCP - General (Family Medicine)   Outpatient Medications Prior to Visit  Medication Sig   [DISCONTINUED] azithromycin (ZITHROMAX Z-PAK) 250 MG tablet Take two right away Then one a day for the next 4 days. (Patient not taking: Reported on 06/01/2023)   [DISCONTINUED] ciprofloxacin-dexamethasone (CIPRODEX) OTIC suspension Place 4 drops into both ears 2 (two) times daily.   [DISCONTINUED] fish oil-omega-3 fatty acids 1000 MG capsule Take 2 capsules (2 g total) by mouth daily. (Patient not taking: Reported on 06/01/2023)   [DISCONTINUED] rosuvastatin (CRESTOR) 10 MG tablet Take 1 tablet (10 mg total) by mouth daily. (Patient not taking: Reported on 06/01/2023)   [DISCONTINUED] Vitamin D, Ergocalciferol, (DRISDOL) 1.25 MG (50000 UNIT) CAPS capsule Take 1 capsule (50,000 Units total) by mouth every 7 (seven) days. (Patient not taking: Reported on 06/01/2023)   No facility-administered medications prior to visit.    ROS Negative unless specially indicated above in HPI.  Objective:     BP 95/66   Pulse 61   Temp (!) 97.3 F (36.3 C)   Ht 5\' 6"  (1.676 m)   Wt 197 lb 9.6 oz (89.6 kg)   SpO2 99%   BMI 31.89 kg/m  Wt Readings from Last 3 Encounters:  08/29/23 197 lb 9.6 oz (89.6 kg)  06/01/23 202 lb 12.8 oz (92 kg)  08/30/22 209 lb (94.8 kg)      Physical Exam Vitals and nursing note reviewed.  Constitutional:      General: She is not in acute distress.    Appearance: Normal appearance. She is not ill-appearing.  HENT:     Head: Normocephalic.     Right Ear: Tympanic membrane, ear canal and external ear normal.     Left Ear: Tympanic membrane, ear canal and external ear normal.     Nose: Nose normal.     Mouth/Throat:     Mouth: Mucous membranes are moist.     Pharynx: Oropharynx is clear.  Eyes:     Extraocular Movements: Extraocular movements intact.     Conjunctiva/sclera: Conjunctivae normal.     Pupils: Pupils are equal, round, and reactive to light.   Neck:     Thyroid: No thyroid mass, thyromegaly or thyroid tenderness.  Cardiovascular:     Rate and Rhythm: Normal rate and regular rhythm.     Pulses: Normal pulses.     Heart sounds: Normal heart sounds. No murmur heard.    No friction rub. No gallop.  Pulmonary:     Effort: Pulmonary effort is normal.     Breath sounds: Normal breath sounds.  Abdominal:     General: Bowel sounds are normal. There is no distension.     Palpations: Abdomen is soft. There is no mass.     Tenderness: There is no abdominal tenderness. There is no guarding.  Musculoskeletal:     Cervical back: Normal range of motion and neck supple. No tenderness.     Right lower leg: No edema.     Left lower leg: No edema.  Skin:    General: Skin is warm and dry.     Capillary Refill: Capillary refill takes less than 2 seconds.  Neurological:     General: No focal deficit present.     Mental Status: She is alert and oriented to person, place, and time.     Cranial Nerves: No cranial nerve deficit.     Motor: No weakness.     Coordination: Coordination normal.     Gait: Gait normal.  Psychiatric:        Mood and Affect: Mood normal.        Behavior: Behavior normal.        Thought Content: Thought content normal.        Judgment: Judgment normal.      No results found for any visits on 08/29/23.     Assessment & Plan:    Routine Health Maintenance and Physical Exam  Kimberly Ayala was seen today for annual exam.  Diagnoses and all orders for this visit:  Routine general medical examination at a health care facility  Vitamin D deficiency -     VITAMIN D 25 Hydroxy (Vit-D Deficiency, Fractures)  Mixed hyperlipidemia Fasting labs pending.  -     Lipid panel  Class 1 obesity due to excess calories with serious comorbidity and body mass index (BMI) of 31.0 to 31.9 in adult Trending down. Diet, exercise, weight loss.  -  CMP14+EGFR -     CBC with Differential/Platelet -     TSH -     Bayer DCA Hb A1c  Waived    Immunization History  Administered Date(s) Administered   PFIZER Comirnaty(Gray Top)Covid-19 Tri-Sucrose Vaccine 01/08/2021, 02/05/2021   Tdap 09/14/2017    Health Maintenance  Topic Date Due   COVID-19 Vaccine (3 - Pfizer risk series) 09/14/2023 (Originally 03/05/2021)   INFLUENZA VACCINE  11/07/2023 (Originally 03/10/2023)   Zoster Vaccines- Shingrix (1 of 2) 11/27/2023 (Originally 03/13/1987)   Colonoscopy  10/01/2024 (Originally 03/12/2013)   MAMMOGRAM  10/12/2023   Cervical Cancer Screening (HPV/Pap Cotest)  07/22/2026   DTaP/Tdap/Td (2 - Td or Tdap) 09/15/2027   Hepatitis C Screening  Completed   HIV Screening  Completed   HPV VACCINES  Aged Out    Discussed health benefits of physical activity, and encouraged her to engage in regular exercise appropriate for her age and condition.  Problem List Items Addressed This Visit       Other   Vitamin D deficiency   Relevant Orders   VITAMIN D 25 Hydroxy (Vit-D Deficiency, Fractures)   Mixed hyperlipidemia   Relevant Orders   Lipid panel   Other Visit Diagnoses       Routine general medical examination at a health care facility    -  Primary     Class 1 obesity due to excess calories with serious comorbidity and body mass index (BMI) of 31.0 to 31.9 in adult       Relevant Orders   CMP14+EGFR   CBC with Differential/Platelet   TSH   Bayer DCA Hb A1c Waived      Return in about 1 year (around 08/28/2024) for CPE.   The patient indicates understanding of these issues and agrees with the plan.  Gabriel Earing, FNP

## 2023-08-30 LAB — LIPID PANEL
Chol/HDL Ratio: 3.5 {ratio} (ref 0.0–4.4)
Cholesterol, Total: 208 mg/dL — ABNORMAL HIGH (ref 100–199)
HDL: 59 mg/dL (ref >39)
LDL Chol Calc (NIH): 131 mg/dL — ABNORMAL HIGH (ref 0–99)
Triglycerides: 103 mg/dL (ref 0–149)
VLDL Cholesterol Cal: 18 mg/dL (ref 5–40)

## 2023-08-30 LAB — CMP14+EGFR
ALT: 15 [IU]/L (ref 0–32)
AST: 20 [IU]/L (ref 0–40)
Albumin: 3.8 g/dL (ref 3.8–4.9)
Alkaline Phosphatase: 69 [IU]/L (ref 44–121)
BUN/Creatinine Ratio: 22 (ref 9–23)
BUN: 16 mg/dL (ref 6–24)
Bilirubin Total: 0.3 mg/dL (ref 0.0–1.2)
CO2: 26 mmol/L (ref 20–29)
Calcium: 9.2 mg/dL (ref 8.7–10.2)
Chloride: 104 mmol/L (ref 96–106)
Creatinine, Ser: 0.72 mg/dL (ref 0.57–1.00)
Globulin, Total: 2.7 g/dL (ref 1.5–4.5)
Glucose: 89 mg/dL (ref 70–99)
Potassium: 4.3 mmol/L (ref 3.5–5.2)
Sodium: 139 mmol/L (ref 134–144)
Total Protein: 6.5 g/dL (ref 6.0–8.5)
eGFR: 99 mL/min/{1.73_m2} (ref >59)

## 2023-08-30 LAB — CBC WITH DIFFERENTIAL/PLATELET
Basophils Absolute: 0.1 10*3/uL (ref 0.0–0.2)
Basos: 1 %
EOS (ABSOLUTE): 0.2 10*3/uL (ref 0.0–0.4)
Eos: 3 %
Hematocrit: 38.9 % (ref 34.0–46.6)
Hemoglobin: 12.7 g/dL (ref 11.1–15.9)
Immature Grans (Abs): 0 10*3/uL (ref 0.0–0.1)
Immature Granulocytes: 0 %
Lymphocytes Absolute: 2.3 10*3/uL (ref 0.7–3.1)
Lymphs: 35 %
MCH: 27.3 pg (ref 26.6–33.0)
MCHC: 32.6 g/dL (ref 31.5–35.7)
MCV: 84 fL (ref 79–97)
Monocytes Absolute: 0.5 10*3/uL (ref 0.1–0.9)
Monocytes: 7 %
Neutrophils Absolute: 3.6 10*3/uL (ref 1.4–7.0)
Neutrophils: 54 %
Platelets: 221 10*3/uL (ref 150–450)
RBC: 4.65 x10E6/uL (ref 3.77–5.28)
RDW: 12.7 % (ref 11.7–15.4)
WBC: 6.8 10*3/uL (ref 3.4–10.8)

## 2023-08-30 LAB — TSH: TSH: 3.18 u[IU]/mL (ref 0.450–4.500)

## 2023-08-30 LAB — VITAMIN D 25 HYDROXY (VIT D DEFICIENCY, FRACTURES): Vit D, 25-Hydroxy: 16.5 ng/mL — ABNORMAL LOW (ref 30.0–100.0)

## 2023-08-31 ENCOUNTER — Encounter: Payer: Self-pay | Admitting: Family Medicine

## 2023-08-31 ENCOUNTER — Other Ambulatory Visit: Payer: Self-pay | Admitting: Family Medicine

## 2023-08-31 DIAGNOSIS — E559 Vitamin D deficiency, unspecified: Secondary | ICD-10-CM

## 2023-08-31 MED ORDER — VITAMIN D (ERGOCALCIFEROL) 1.25 MG (50000 UNIT) PO CAPS: 50000.0000 [IU] | ORAL_CAPSULE | ORAL | 0 refills | Status: AC

## 2023-09-16 ENCOUNTER — Other Ambulatory Visit: Payer: Self-pay | Admitting: Family Medicine

## 2023-09-16 DIAGNOSIS — Z1231 Encounter for screening mammogram for malignant neoplasm of breast: Secondary | ICD-10-CM

## 2023-10-14 ENCOUNTER — Ambulatory Visit
Admission: RE | Admit: 2023-10-14 | Discharge: 2023-10-14 | Disposition: A | Discharge status: Home / Self Care | Payer: Medicaid Other | Source: Ambulatory Visit | Attending: Family Medicine | Admitting: Family Medicine

## 2023-10-14 ENCOUNTER — Encounter (HOSPITAL_BASED_OUTPATIENT_CLINIC_OR_DEPARTMENT_OTHER): Payer: Medicaid Other | Admitting: Radiology

## 2023-10-14 DIAGNOSIS — Z1231 Encounter for screening mammogram for malignant neoplasm of breast: Secondary | ICD-10-CM

## 2024-08-29 ENCOUNTER — Encounter: Payer: Medicaid Other | Admitting: Family Medicine

## 2024-08-30 ENCOUNTER — Other Ambulatory Visit: Payer: Self-pay | Admitting: Family Medicine

## 2024-08-30 DIAGNOSIS — Z1231 Encounter for screening mammogram for malignant neoplasm of breast: Secondary | ICD-10-CM

## 2024-10-15 ENCOUNTER — Ambulatory Visit

## 2024-10-25 ENCOUNTER — Encounter: Admitting: Family Medicine
